# Patient Record
Sex: Male | Born: 1935 | Race: White | Hispanic: No | Marital: Married | Smoking: Former smoker
Health system: Southern US, Community
[De-identification: ages and names within clinical notes are randomized; demographics above are authoritative.]

## PROBLEM LIST (undated history)

## (undated) DIAGNOSIS — I350 Nonrheumatic aortic (valve) stenosis: Secondary | ICD-10-CM

## (undated) DIAGNOSIS — I442 Atrioventricular block, complete: Secondary | ICD-10-CM

## (undated) DIAGNOSIS — I10 Essential (primary) hypertension: Secondary | ICD-10-CM

## (undated) DIAGNOSIS — H9190 Unspecified hearing loss, unspecified ear: Secondary | ICD-10-CM

## (undated) DIAGNOSIS — I639 Cerebral infarction, unspecified: Secondary | ICD-10-CM

## (undated) DIAGNOSIS — S2249XA Multiple fractures of ribs, unspecified side, initial encounter for closed fracture: Secondary | ICD-10-CM

## (undated) HISTORY — DX: Cerebral infarction, unspecified: I63.9

## (undated) HISTORY — PX: HEMORRHOID SURGERY: SHX153

## (undated) HISTORY — DX: Essential (primary) hypertension: I10

## (undated) HISTORY — DX: Atrioventricular block, complete: I44.2

## (undated) HISTORY — DX: Nonrheumatic aortic (valve) stenosis: I35.0

## (undated) HISTORY — DX: Unspecified hearing loss, unspecified ear: H91.90

## (undated) HISTORY — DX: Multiple fractures of ribs, unspecified side, initial encounter for closed fracture: S22.49XA

---

## 2006-07-03 ENCOUNTER — Ambulatory Visit: Payer: Self-pay | Admitting: Gastroenterology

## 2006-07-16 ENCOUNTER — Ambulatory Visit: Payer: Self-pay | Admitting: Gastroenterology

## 2006-07-21 ENCOUNTER — Encounter: Payer: Self-pay | Admitting: Gastroenterology

## 2006-07-21 ENCOUNTER — Ambulatory Visit: Payer: Self-pay | Admitting: Gastroenterology

## 2006-07-28 ENCOUNTER — Ambulatory Visit: Payer: Self-pay | Admitting: Gastroenterology

## 2007-03-04 ENCOUNTER — Encounter: Payer: Self-pay | Admitting: Cardiology

## 2007-09-03 ENCOUNTER — Encounter: Payer: Self-pay | Admitting: Cardiology

## 2007-11-05 ENCOUNTER — Ambulatory Visit: Payer: Self-pay | Admitting: Cardiology

## 2007-11-06 ENCOUNTER — Ambulatory Visit: Payer: Self-pay | Admitting: Cardiology

## 2007-11-24 ENCOUNTER — Ambulatory Visit: Payer: Self-pay | Admitting: Cardiology

## 2007-11-24 ENCOUNTER — Ambulatory Visit: Payer: Self-pay | Admitting: Internal Medicine

## 2007-11-24 LAB — CONVERTED CEMR LAB
CO2: 29 meq/L (ref 19–32)
Calcium: 9.3 mg/dL (ref 8.4–10.5)
Chloride: 100 meq/L (ref 96–112)
Eosinophils Relative: 2.1 % (ref 0.0–5.0)
GFR calc non Af Amer: 70 mL/min
Glucose, Bld: 109 mg/dL — ABNORMAL HIGH (ref 70–99)
INR: 1 (ref 0.8–1.0)
Lymphocytes Relative: 20.9 % (ref 12.0–46.0)
MCHC: 34.2 g/dL (ref 30.0–36.0)
MCV: 83.2 fL (ref 78.0–100.0)
Monocytes Relative: 13.7 % — ABNORMAL HIGH (ref 3.0–11.0)
Neutrophils Relative %: 63.1 % (ref 43.0–77.0)
Potassium: 3.3 meq/L — ABNORMAL LOW (ref 3.5–5.1)
RBC: 4.91 M/uL (ref 4.22–5.81)

## 2007-12-04 ENCOUNTER — Ambulatory Visit: Payer: Self-pay | Admitting: Internal Medicine

## 2007-12-04 ENCOUNTER — Ambulatory Visit (HOSPITAL_COMMUNITY): Admission: RE | Admit: 2007-12-04 | Discharge: 2007-12-05 | Payer: Self-pay | Admitting: Internal Medicine

## 2007-12-04 HISTORY — PX: OTHER SURGICAL HISTORY: SHX169

## 2007-12-21 ENCOUNTER — Ambulatory Visit: Payer: Self-pay

## 2008-03-18 ENCOUNTER — Ambulatory Visit: Payer: Self-pay | Admitting: Internal Medicine

## 2008-04-21 ENCOUNTER — Ambulatory Visit: Payer: Self-pay | Admitting: Internal Medicine

## 2008-12-09 ENCOUNTER — Ambulatory Visit: Payer: Self-pay | Admitting: Internal Medicine

## 2009-03-29 ENCOUNTER — Encounter: Payer: Self-pay | Admitting: Internal Medicine

## 2009-10-07 DIAGNOSIS — I442 Atrioventricular block, complete: Secondary | ICD-10-CM | POA: Insufficient documentation

## 2009-10-07 DIAGNOSIS — R5381 Other malaise: Secondary | ICD-10-CM

## 2009-10-07 DIAGNOSIS — R5383 Other fatigue: Secondary | ICD-10-CM

## 2009-11-30 ENCOUNTER — Encounter: Payer: Self-pay | Admitting: Cardiology

## 2009-12-08 ENCOUNTER — Ambulatory Visit: Payer: Self-pay | Admitting: Internal Medicine

## 2010-07-27 ENCOUNTER — Ambulatory Visit: Payer: Self-pay | Admitting: Internal Medicine

## 2010-07-27 DIAGNOSIS — I10 Essential (primary) hypertension: Secondary | ICD-10-CM | POA: Insufficient documentation

## 2011-01-29 NOTE — Assessment & Plan Note (Signed)
Summary: PER REMINDER LETTER/SN   Visit Type:  Pacemaker check Primary Provider:  Charm Barges   History of Present Illness: The patient presents today for routine electrophysiology followup. He reports doing very well since last being seen in our clinic. The patient denies symptoms of palpitations, chest pain, shortness of breath, orthopnea, PND, lower extremity edema, dizziness, presyncope, syncope, or neurologic sequela. The patient is tolerating medications without difficulties and is otherwise without complaint today.   Preventive Screening-Counseling & Management  Alcohol-Tobacco     Smoking Status: quit     Year Quit: 1980  Current Medications (verified): 1)  Amlodipine Besylate 10 Mg Tabs (Amlodipine Besylate) .... Take 1 Tablet By Mouth Once A Day 2)  Travatan Z 0.004 % Soln (Travoprost) .... Use As Directed 3)  Simvastatin 40 Mg Tabs (Simvastatin) .... Take 1 Tablet By Mouth Once A Day 4)  Metformin Hcl 1000 Mg Tabs (Metformin Hcl) .... Take 1 Tablet By Mouth Two Times A Day 5)  Losartan Potassium 100 Mg Tabs (Losartan Potassium) .... Take 1 Tablet By Mouth Once A Day 6)  Indapamide 2.5 Mg Tabs (Indapamide) .... Take 1 Tablet By Mouth Once A Day 7)  Aspir-Low 81 Mg Tbec (Aspirin) .... Take 1 Tablet By Mouth Once A Day 8)  Pantoprazole Sodium 40 Mg Tbec (Pantoprazole Sodium) .... Take 1 Tablet By Mouth Once A Day 9)  Multivitamins  Tabs (Multiple Vitamin) .... Take 1 Tablet By Mouth Once A Day 10)  Fish Oil 1000 Mg Caps (Omega-3 Fatty Acids) .... Take 1 Tablet By Mouth Once A Day 11)  Cardura 4 Mg Tabs (Doxazosin Mesylate) .... Take 1 Tablet By Mouth Once A Day 12)  Lantus 100 Unit/ml Soln (Insulin Glargine) .... Use As Directed 13)  Gemfibrozil 600 Mg Tabs (Gemfibrozil) .... Take 1 Tablet By Mouth Once A Day 14)  Vitamin D3 5000 Unit Caps (Cholecalciferol) .... Take 1 Tablet By Mouth Once A Day  Allergies (verified): 1)  ! Codeine  Comments:  Nurse/Medical Assistant: The  patient's medication list and allergies were reviewed with the patient and were updated in the Medication and Allergy Lists.  Past History:  Past Medical History: FATIGUE / MALAISE (ICD-780.79) Complete Heart block s/p PPM HTN HL Diabetes  Past Surgical History: hemrrhoid Medtronic Rhame PPM  Social History: Reviewed history from 10/07/2009 and no changes required. Retired  Married  Tobacco Use - Former.  Alcohol Use - no  Vital Signs:  Patient profile:   77 year old male Height:      72 inches Weight:      239 pounds BMI:     32.53 Pulse rate:   79 / minute BP sitting:   116 / 74  (left arm) Cuff size:   large  Vitals Entered By: Carlye Grippe (July 27, 2010 3:18 PM)  Nutrition Counseling: Patient's BMI is greater than 25 and therefore counseled on weight management options.  Physical Exam  General:  Well developed, well nourished, in no acute distress. Head:  normocephalic and atraumatic Eyes:  PERRLA/EOM intact; conjunctiva and lids normal. Mouth:  Teeth, gums and palate normal. Oral mucosa normal. Neck:  Neck supple, no JVD. No masses, thyromegaly or abnormal cervical nodes. Chest Wall:  pacemaker pocket is well healed Lungs:  Clear bilaterally to auscultation and percussion. Heart:  Non-displaced PMI, chest non-tender; regular rate and rhythm, S1, S2 without murmurs, rubs or gallops. Carotid upstroke normal, no bruit. Normal abdominal aortic size, no bruits. Femorals normal pulses, no bruits. Pedals normal  pulses. No edema, no varicosities. Abdomen:  Bowel sounds positive; abdomen soft and non-tender without masses, organomegaly, or hernias noted. No hepatosplenomegaly. Msk:  Back normal, normal gait. Muscle strength and tone normal. Pulses:  pulses normal in all 4 extremities Extremities:  No clubbing or cyanosis. Neurologic:  Alert and oriented x 3.   PPM Specifications Following MD:  Lewayne Bunting, MD     PPM Vendor:  Medtronic     PPM Model Number:   [id number]     PPM Serial Number:  [id number] PPM DOI:  12/04/2007     PPM Implanting MD:  Lewayne Bunting, MD  Lead 1    Location: RA     DOI: 12/04/2007     Model #: [id number]     Serial #: [id number]     Status: active Lead 2    Location: RV     DOI: 12/04/2007     Model #: [id number]     Serial #: [id number]     Status: active  Magnet Response Rate:  BOL 85 ERI 65  Indications:  CHB; brady   PPM Follow Up Battery Voltage:  2.78 V     Battery Est. Longevity:  34yrs     Pacer Dependent:  No       PPM Device Measurements Atrium  Amplitude: 4.00 mV, Impedance: 412 ohms, Threshold: 0.750 V at 0.40 msec Right Ventricle  Amplitude: 8.00 mV, Impedance: 518 ohms, Threshold: 0.750 V at 0.40 msec  Episodes MS Episodes:  23     Percent Mode Switch:  <0.1%     Coumadin:  No Ventricular High Rate:  0     Atrial Pacing:  62.2%     Ventricular Pacing:  99.9%  Parameters Mode:  DDDR     Lower Rate Limit:  60     Upper Rate Limit:  130 Paced AV Delay:  150     Sensed AV Delay:  120 Next Cardiology Appt Due:  12/31/2010 Tech Comments:  23 MODE SWITCHES--<0.1% OF TIME.  NORMAL DEVICE FUNCTION.  CHANGED RA OUTPUT FROM 1.5 TO 2.0 AND RV OUTPUT FROM 2.0 TO 2.5 V.  ROV IN [facility]. Vella Kohler  July 27, 2010 3:43 PM MD Comments:  agree  Impression & Recommendations:  Problem # 1:  AV BLOCK, COMPLETE (ICD-426.0) Normal pacemaker function changes as above  Problem # 2:  HYPERTENSION, MILD (ICD-401.1) stable no changes today  CHF Assessment/Plan:      The patient's current weight is 239 pounds.     Patient Instructions: 1)  Take 1/2 of your simvastatin tablet at bedtime. 2)  Your physician wants you to follow-up in: 6 months. You will receive a reminder letter in the mail one-two months in advance. If you don't receive a letter, please call our office to schedule the follow-up appointment.

## 2011-01-29 NOTE — Cardiovascular Report (Signed)
Summary: Card Device Clinic/ INTERROGATION REPORT  Card Device Clinic/ INTERROGATION REPORT   Imported By: Dorise Hiss 08/03/2010 10:56:01  _____________________________________________________________________  External Attachment:    Type:   Image     Comment:   External Document

## 2011-02-18 ENCOUNTER — Encounter (INDEPENDENT_AMBULATORY_CARE_PROVIDER_SITE_OTHER): Payer: MEDICARE | Admitting: Internal Medicine

## 2011-02-18 ENCOUNTER — Encounter: Payer: Self-pay | Admitting: Internal Medicine

## 2011-02-18 DIAGNOSIS — I442 Atrioventricular block, complete: Secondary | ICD-10-CM

## 2011-02-26 NOTE — Assessment & Plan Note (Signed)
Summary: PC2/LG   Visit Type:  Follow-up Primary Provider:  Charm Barges  CC:  none.  History of Present Illness: The patient presents today for routine electrophysiology followup. He reports doing very well since last being seen in our clinic. The patient denies symptoms of palpitations, chest pain, shortness of breath, orthopnea, PND, lower extremity edema, dizziness, presyncope, syncope, or neurologic sequela. The patient is tolerating medications without difficulties and is otherwise without complaint today.   Current Medications (verified): 1)  Amlodipine Besylate 10 Mg Tabs (Amlodipine Besylate) .... Take 1 Tablet By Mouth Once A Day 2)  Travatan Z 0.004 % Soln (Travoprost) .... Use As Directed 3)  Simvastatin 40 Mg Tabs (Simvastatin) .... Take 1/2 Tablet By Mouth Once A Day 4)  Metformin Hcl 1000 Mg Tabs (Metformin Hcl) .... Take 1 Tablet By Mouth Two Times A Day 5)  Losartan Potassium 100 Mg Tabs (Losartan Potassium) .... Take 1 Tablet By Mouth Once A Day 6)  Indapamide 2.5 Mg Tabs (Indapamide) .... Take 1 Tablet By Mouth Once A Day 7)  Aspir-Low 81 Mg Tbec (Aspirin) .... Take 1 Tablet By Mouth Once A Day 8)  Pantoprazole Sodium 40 Mg Tbec (Pantoprazole Sodium) .... Take 1 Tablet By Mouth Once A Day 9)  Multivitamins  Tabs (Multiple Vitamin) .... Take 1 Tablet By Mouth Once A Day 10)  Fish Oil 1000 Mg Caps (Omega-3 Fatty Acids) .... Take 1 Tablet By Mouth Once A Day 11)  Cardura 4 Mg Tabs (Doxazosin Mesylate) .... Take 1 Tablet By Mouth Once A Day 12)  Lantus 100 Unit/ml Soln (Insulin Glargine) .... Use As Directed 13)  Gemfibrozil 600 Mg Tabs (Gemfibrozil) .... Take 1 Tablet By Mouth Once A Day 14)  Vitamin D3 5000 Unit Caps (Cholecalciferol) .... Take 1 Tablet By Mouth Once A Day 15)  Preservision Areds  Caps (Multiple Vitamins-Minerals) .Marland Kitchen.. 1 By Mouth Two Times A Day  Allergies (verified): 1)  ! Codeine  Past History:  Past Medical History: Reviewed history from 07/27/2010 and  no changes required. FATIGUE / MALAISE (ICD-780.79) Complete Heart block s/p PPM HTN HL Diabetes  Past Surgical History: hemrrhoid Medtronic Sensia PPM implanted 12/04/07 by [facility]  Social History: Reviewed history from 10/07/2009 and no changes required. Retired  Married  Tobacco Use - Former.  Alcohol Use - no  Vital Signs:  Patient profile:   77 year old male Height:      72 inches Weight:      241 pounds BMI:     32.80 Pulse rate:   76 / minute Resp:     16 per minute BP sitting:   129 / 84  (left arm)  Vitals Entered By: Marrion Coy, CNA (February 18, 2011 4:14 PM)  Physical Exam  General:  Well developed, well nourished, in no acute distress. Head:  normocephalic and atraumatic Eyes:  PERRLA/EOM intact; conjunctiva and lids normal. Mouth:  Teeth, gums and palate normal. Oral mucosa normal. Neck:  supple Chest Wall:  pacemaker pocket is well healed Lungs:  Clear bilaterally to auscultation and percussion. Heart:  RRR, no m/r/g Abdomen:  Bowel sounds positive; abdomen soft and non-tender without masses, organomegaly, or hernias noted. No hepatosplenomegaly. Msk:  Back normal, normal gait. Muscle strength and tone normal. Extremities:  No clubbing or cyanosis. Neurologic:  Alert and oriented x 3. Skin:  Intact without lesions or rashes. Psych:  Normal affect.   PPM Specifications Following MD:  Lewayne Bunting, MD     PPM Vendor:  Medtronic  PPM Model Number:  [id number]     PPM Serial Number:  [id number] PPM DOI:  12/04/2007     PPM Implanting MD:  Lewayne Bunting, MD  Lead 1    Location: RA     DOI: 12/04/2007     Model #: 9147     Serial #: [id number]     Status: active Lead 2    Location: RV     DOI: 12/04/2007     Model #: 6578     Serial #: [id number]     Status: active  Magnet Response Rate:  BOL 85 ERI 65  Indications:  CHB; brady   PPM Follow Up Pacer Dependent:  No      Episodes Coumadin:  No  Parameters Mode:  DDDR     Lower Rate Limit:  60      Upper Rate Limit:  130 Paced AV Delay:  150     Sensed AV Delay:  120 MD Comments:  see scanned report  Impression & Recommendations:  Problem # 1:  AV BLOCK, COMPLETE (ICD-426.0) normal pacemaker function see scanned report return to device clinic in 6 months  Problem # 2:  HYPERTENSION, MILD (ICD-401.1) stable no changes today

## 2011-02-26 NOTE — Cardiovascular Report (Signed)
Summary: Card Device Clinic/ FINAL REPORT  Card Device Clinic/ FINAL REPORT   Imported By: Dorise Hiss 02/19/2011 17:02:36  _____________________________________________________________________  External Attachment:    Type:   Image     Comment:   External Document

## 2011-05-14 NOTE — Discharge Summary (Signed)
[name], [name]              ACCOUNT NO.:  [id number]   MEDICAL RECORD NO.:  [id number]          PATIENT TYPE:  OIB   LOCATION:  3741                         FACILITY:  [facility]   PHYSICIAN:  Gerrit Friends. Dietrich Pates, MD, FACCDATE OF BIRTH:  [birth year]   DATE OF ADMISSION:  12/04/2007  DATE OF DISCHARGE:  12/05/2007                               DISCHARGE SUMMARY   PRIMARY CARDIOLOGIST:  Learta Codding, MD,FACC.   ELECTROPHYSIOLOGIST:  Doylene Canning. Ladona Ridgel,   PRIMARY CARE Evah Rashid:  Dr. Samuel Jester.   DISCHARGE DIAGNOSIS:  Symptomatic bradycardia with 2 to 1 second-degree  type 2 heart block.   SECONDARY DIAGNOSES:  1. Hypertension.  2. Type 2 diabetes mellitus.  3. Hypothyroidism.  4. Gastroesophageal reflux disease.  5. Hyperlipidemia.  6. Depression.   ALLERGIES:  CODEINE.   PROCEDURE:  Successful implantation of Medtronic Sensia dual-chamber  permanent pacemaker, model number [id number].   HISTORY OF PRESENT ILLNESS:  A 77 year old male followed by Dr. Andee Lineman.  He had a several month history of shortness of breath and fatigue as  well as occasional dizziness and lightheadedness.  He was placed on a  [employer] monitor by Tereso Newcomer, PA-C and Dr. Andee Lineman and was found to  exhibit second-degree type 2 heart block with ventricular rates in the  40s to 50s.  He was therefore arranged for follow-up with Dr. Ladona Ridgel on  November 24, 2007, and subsequently scheduled for pacemaker placement.   HOSPITAL COURSE:  Patient was taken to the EP lab on December 04, 2007,  and underwent successful placement of Medtronic [place] dual-chamber  permanent pacemaker as outlined above.  He tolerated this procedure well  and postprocedure chest x-ray showed no effusions or pneumothorax.  We  will arrange for outpatient follow-up in device clinic as well as with  Dr. Ladona Ridgel and the patient will be discharged home today in good  condition.   DISCHARGE LABORATORY DATA:  None.   DISPOSITION:  The  patient is being discharged home today in good  condition.   FOLLOW UP PLANS AND APPOINTMENTS:  Will arrange for device clinic follow-  up in approximately 10-14 days.  This will take place in our [place]  office.  Follow up with Dr. Ladona Ridgel approximately three months.  Follow  up with Dr. Andee Lineman as previously scheduled.   DISCHARGE MEDICATIONS:  1. Paroxetine 40 mg daily.  2. Indapamide 2.5 mg daily.  3. Glyburide 10 mg daily.  4. Metformin 1000 mg daily.  5. Simvastatin 10 mg nightly.  6. Travatan 0.04% one drop both eyes daily.  7. Amlodipine 5 mg daily.  8. Levothyroxine 50 mcg daily.  9. Protonix 40 mg daily.  10.Losartan 100 mg daily.  11.Aspirin 81 mg daily.  12.Multivitamin with iron daily.   OUTSTANDING LABORATORY STUDIES:  None.   DURATION OF DISCHARGE ENCOUNTER:  31 minutes including physician time.      Nicolasa Ducking, ANP      Gerrit Friends. Dietrich Pates, MD, [facility]  Electronically Signed    CB/MEDQ  D:  12/05/2007  T:  12/06/2007  Job:  [id number]   cc:   Aram Beecham  Charm Barges

## 2011-05-14 NOTE — Op Note (Signed)
[name], [name]              ACCOUNT NO.:  [id number]   MEDICAL RECORD NO.:  [id number]          PATIENT TYPE:  OIB   LOCATION:  2807                         FACILITY:  [facility]   PHYSICIAN:  Doylene Canning. Ladona Ridgel, MD    DATE OF BIRTH:  08-20-1933   DATE OF PROCEDURE:  12/04/2007  DATE OF DISCHARGE:                               OPERATIVE REPORT   PROCEDURE PERFORMED:  Implantation of a dual-chamber pacemaker.   INDICATIONS:  Symptomatic bradycardia with complete heart block.   INTRODUCTION:  The patient is a 77 year old male with a history of high-  grade heart block, who is referred down from Dr. Andee Lineman.  He was mildly  symptomatic.  He notes that over the last several days he has gotten  more fatigued and was found today to be in complete heart block and is  referred in for pacemaker implantation.   PROCEDURE:  After informed consent was obtained, the patient was taken  to the diagnostic EP lab in a fasting state.  After usual the  preparation and draping, intravenous fentanyl and midazolam were given  for sedation.  Lidocaine 30 mL was infiltrated into the left  infraclavicular region.  A 5-cm incision was carried out over this  region and electrocautery was utilized to dissect down to the fascial  plane.  The left subclavian vein was then punctured x2 and the Medtronic  model 5076 58-cm active-fixation pacing lead, serial number [id number],  was advanced into the right ventricle and the Medtronic model 5076 52-cm  active-fixation pacing lead, serial number [id number], advanced to the  right atrium.  Mapping was carried out in the right ventricle.  At the  final site on the RV septum the R-waves measured 8 mV  and the pacing  threshold was a volt at 0.5 msec.  Ten-volt pacing did not stimulate the  diaphragm.  The pacing impedance was satisfactory though I do not have  it at the time of this dictation.  Next, attention was then turned  placement of the atrial lead, which was  placed in the anterolateral  portion of the right atrium where P-waves measured 6 mV and the  threshold 0.5 V at 0.5 msec, and again 10-volt pacing did not stimulate  the diaphragm.  There was a nice injury current noted.  Again the pacing  impedance was within normal limits but not available at time of this  dictation.  At this point with the leads in satisfactory position, they  was secured to the subpectoralis fascia with a silk suture.  The sewing  sleeve was also secured with silk suture.  Electrocautery was utilized  to assure subcutaneous a pocket and kanamycin irrigation was utilized to  irrigate the pocket.  Electrocautery was utilized to assure hemostasis.  The Medtronic Sensia dual-chamber pacemaker serial, number [id number],  was connected to the atrial and ventricular leads and placed back in the  subcutaneous pocket.  The generator was secured with silk suture.  At  this point the incision was closed with a layer of 2-0 Vicryl, followed  by a layer of 3-0 Vicryl.  Benzoin was  painted on the skin and Steri-  Strips were applied and a pressure dressing was placed and the patient  was returned to his room in satisfactory condition.   COMPLICATIONS:  There were no immediate procedure complications.   RESULTS:  This demonstrates successful implantation of a Medtronic dual-  chamber pacemaker in a patient with symptomatic bradycardia with  complete heart block.      Doylene Canning. Ladona Ridgel, MD  Electronically Signed     GWT/MEDQ  D:  12/04/2007  T:  12/04/2007  Job:  [id number]   cc:   Learta Codding, MD,FACC

## 2011-05-14 NOTE — Assessment & Plan Note (Signed)
[facility]                                 ON-CALL NOTE   NAME:[name], [name]                     MRN:          [id number]  DATE:11/23/2007                            DOB:          02/01/1933    DATE OF PHONE CALL:  November 23, 2007, at approximately 6:30 a.m.   PRIMARY CARDIOLOGIST:  Learta Codding, MD   PRIMARY CARE PHYSICIAN:  Dr. Charm Barges.   [employer] employee [employer] contacted me with an urgent page in regard to  Mr. Weitzel with bradycardia.  He stated the heart rate was in the low  30s and the monitor showed second-degree heart block.  The monitor was  self-activated and was not activated by the patient. A  recheck showed  the heart rate in the upper 30s, again second-degree AV block.   I asked [employer] to fax me the rhythm strips so that I may review them.  After review, I agree with their interpretation.  I have spoken with the  [facility] office and asked them to review these with Dr. Andee Lineman for possible  further treatment plan as his E-Chart cardiology note dated November 05, 2007, refers to multiple tests that have recently been performed such as  an echocardiogram and exercise stress test to look at chronotropic  incompetence and a Cardiolite to rule out ischemic heart disease and the  possible need for a pacer.  They also plan to contact the patient with  further recommendations.      Joellyn Rued, PA-C  Electronically Signed      Pricilla Riffle, MD, [facility]  Electronically Signed   EW/MedQ  DD: 11/23/2007  DT: 11/23/2007  Job #: [id number]   cc:   Learta Codding, MD,FACC

## 2011-05-14 NOTE — Cardiovascular Report (Signed)
[place] [facility]                   [facility] CL[facility]IC NOTE   NAME:[name], [name]                     MRN:          [id number]  DATE:12/09/2008                            DOB:          06/15/1933    Mr. [name] was seen following pacemaker implantation about a year ago  that was done by Dr. Lewayne Bunting.   I met him last spring.  The other symptoms were exercise intolerance.  This has much improved; however, he continues to wish he had more  energy.   Review of his medications which are legion include amlodipine, losartan,  indapamide, glyburide, pantoprazole,  Tripilix, simvastatin.  He is not  on a beta-blocker.  He has normal left ventricular function.   On examination today, his blood pressure was 144/84 with a pulse of 84,  weight is 246.  His lungs were clear.  Heart sounds were regular without  murmurs or gallops.  The abdomen was soft.  The extremities had no  edema.   Interrogation of his Medtronic [place] ICD demonstrates a P-wave of 2.8  with impedance of 391, a threshold 0.5 and 0.4.  There was no intrinsic  ventricular rhythm with impedance of 578, threshold of 0.87 and 0.4.  He  was 100% ventricular paced, and 30% atrial paced.   IMPRESSION:  1. Complete heart block.  2. Status post pacer for the above.  3. Normal left ventricular function.  4. Diabetes.  5. Fatigue.   Mr. [name] is doing well from arrhythmia point of view.  He does have  significant snoring, but no daytime somnolence.  I wonder whether a  sleep study might be worth repeating.   I will defer this to Dr. Charm Barges.   We will see him again in 3 months' time for device followup.     Duke Salvia, MD, [facility]  Electronically Signed    SCK/MedQ  DD: 12/09/2008  DT: 12/10/2008  Job #: [id number]   cc:   Samuel Jester

## 2011-05-14 NOTE — Cardiovascular Report (Signed)
[place] [facility]                   [facility]ICE CLINIC NOTE   NAME:[name], [name]                     MRN:          [id number]  DATE:04/21/2008                            DOB:          [birth year]    [name] is seen following pacemaker implantation in the fall by Dr.  Lewayne Bunting, for symptomatic high-grade heart block associated syncope  and exercise intolerance.  He is much improved without recurrence of  syncope and resolution of the most of the exercise intolerance.   His medications are unchanged, except for the intercurrent up-titration  of his metformin to 1000 b.i.d.  He is also on glyburide, indapamide,  Losartan, simvastatin and verapamil.   PHYSICAL EXAMINATION:  On examination today his blood pressure was  132/82, his pulse was 70.  LUNGS:  Clear.  HEART:  Sounds were regular.  EXTREMITIES:  Without edema.  He was in no acute distress.  His device pocket was well-healed.   Interrogation of his Medtronic SENSA pulse generator demonstrated a P-  wave of 240 with an impedance of 746, with threshold of 0.75 and 0.4.  There was no intrinsic ventricular rhythm.  He was paced at 30 with  impedance of 527, and a threshold of 0.5 at 0.4.   IMPRESSION:  1. High-grade heart block; now complete heart block.  2. Status post pacer for the above.  3. Syncope and exercise intolerance, related to #1.  4. Normal left ventricular function, with nonischemic Myoview at 1108.  5. Diabetes.   [name] is doing well.  At this point, we will plan to see him again  in 7 months' time; at the anniversary of his device.  Will then decide  with Dr. Andee Lineman as to long-term device and cardiology follow-up.     Duke Salvia, MD, [facility]  Electronically Signed    SCK/MedQ  DD: 04/21/2008  DT: 04/21/2008  Job #: [id number]   cc:   Samuel Jester

## 2011-05-14 NOTE — Assessment & Plan Note (Signed)
[facility]                         ELECTROPHYSIOLOGY OFFICE NOTE   NAME:BULLINSRamere, Downs                     MRN:          [id number]  DATE:11/24/2007                            DOB:          [birth year]    REFERRING PHYSICIAN:  Learta Codding, MD,FACC   CHIEF COMPLAINT:  Mr. Rosero is referred today by Dr. Learta Codding for  evaluation of symptomatic 2:1 heart block.   HISTORY OF PRESENT ILLNESS:  The patient is a very pleasant 77 year old  man with a history of coronary artery disease and symptomatic  bradycardia.  The patient who is here with his wife today, states that  he has been short of breath and fatigued for the last several months.  He has occasional dizziness and lightheadedness but has never had frank  syncope.  These symptoms can occur at any time, but typically are more  frequent when he is active.  When he is sitting around and doing  nothing, he feels fine.  He was subsequently found to have a 2:1 heart  block with a ventricular rate of 33 beats per minute in the office  several weeks ago, and is referred now for additional evaluation.  His  heart failure symptoms are class 2.  Otherwise he denies orthopnea or  PND.   PAST MEDICAL HISTORY:  1. Notable for diabetes.  2. Hypertension.  3. Dyslipidemia.  4. History of treated hypothyroidism.  5. History of gastroesophageal reflux disease.  6. History of depression.   PREVIOUS MEDICATIONS:  1. Verapamil, but he is on none now.  2. Terazosin.  3. Simvastatin.  4. Paroxetine 40 mg daily.  5. Metformin 500 mg twice daily.  6. Losartan 100 mg daily.  7. Glyburide 5 mg twice daily.  8. Aspirin 81 mg daily.  9. Now amlodipine 10 mg daily.  10.He also takes fish oil.   ALLERGIES:  CODEINE.   SOCIAL HISTORY:  The patient has a history of tobacco use but stopped  smoking about 15 years ago.  He denies alcohol or drug use.  He is a  retired Psychiatric nurse.  He lives in  [place].   FAMILY HISTORY:  Notable for his mother dying suddenly during pregnancy.  His father lived into his 61's with coronary artery disease.   REVIEW OF SYSTEMS:  Notable for occasional chronic productive cough with  clear sputum.  There is a history of snoring.  There is no history of  fevers or chills.  Otherwise there are no symptoms except for very mild  claudication when he walks.  All other systems are noted in the HPI.   PHYSICAL EXAMINATION:  GENERAL:  He is a pleasant 77 year old man, in no  acute distress.  VITAL SIGNS:  Blood pressure today 124/72, pulse 44 and regular,  respirations 18.  Weight was 243 pounds.  NECK:  No jugular venous distention, no thyromegaly.  Trachea midline.  Carotids 2+ and symmetric.  LUNGS:  Clear bilaterally to auscultation.  No wheezes, rales or rhonchi  were present.  There was no increased work of breathing.  CARDIOVASCULAR:  A regular bradycardia with  normal S1 and S2.  PMI was  not enlarged and was laterally displaced.  EXTREMITIES:  No clubbing, cyanosis or edema.  Pulses 2+ and symmetric.  NEUROLOGIC:  Alert and oriented x3.  Cranial nerves intact, 5/5 and  symmetric.   Electrocardiogram demonstrates sinus rhythm with right bundle branch  block, as well as sinus rhythm with a 2:1 heart block and underlying  right bundle branch block and left axis.   IMPRESSION:  1. Symptomatic bradycardia.  2. Hypertension.  3. Diabetes.   DISCUSSION:  I have discussed the treatment options with the patient.  The risks, benefits, goals and expectations of  a pacemaker implantation  have been discussed with this patient and with his family.  He wishes to  proceed.     Doylene Canning. Ladona Ridgel, MD  Electronically Signed    GWT/MedQ  DD: 11/24/2007  DT: 11/24/2007  Job #: [id number]   cc:   Learta Codding, MD,FACC

## 2011-05-14 NOTE — Assessment & Plan Note (Signed)
[facility]                          [facility] OFFICE NOTE   NAME:[name], [name]                     MRN:          [id number]  DATE:11/05/2007                            DOB:          10/17/1933    ADDENDUM:  Send copies of the original report to Dr. Charm Barges and Dr.  Antionette Poles.      Tereso Newcomer, PA-C  Electronically Signed      Learta Codding, MD,FACC  Electronically Signed   SW/MedQ  DD: 11/05/2007  DT: 11/05/2007  Job #: [id number]   cc:   Rickard Patience

## 2011-05-14 NOTE — Assessment & Plan Note (Signed)
[facility]                                 ON-CALL NOTE   NAME:Gras, [name]                     MRN:          [id number]  DATE:11/23/2007                            DOB:          04/05/1933    I received a phone call from [facility] at approximately 10 p.m. on this  patient concerning a second degree type 2 heart block.  The ventricular  heart rate was approximately 46-50 beats per minute.  According to  [facility] the patient is asymptomatic.  According to [facility] the  patient has been in this rhythm all day long.  I asked the [facility]  folks to fax over the telemetry and they agreed to do so.  The patient  may well need a pacemaker and should have follow-up with Learta Codding,  MD,FACC and Duke Salvia, MD, [facility] concerning this.     Christell Faith, MD  Electronically Signed    NDL/MedQ  DD: 11/24/2007  DT: 11/24/2007  Job #: [id number]

## 2011-05-14 NOTE — Assessment & Plan Note (Signed)
[facility]                          [facility] OFFICE NOTE   NAME:[name], [name]                     MRN:          [id number]  DATE:11/05/2007                            DOB:          March 30, 1933    REASON FOR REFERRAL:  Bradycardia.   HISTORY OF PRESENT ILLNESS:  [name] is a 77 year old male patient  without previously documented coronary artery disease who presents to  the office today for further evaluation of symptomatic bradycardia. He  has noted symptoms over the last 2-3 months. He has been fairly tired  and weak. He is also quite sleepy. He notes occasional dizziness and  lightheadedness. He has also noted near syncope. He denies any frank  syncope. These symptoms usually occur when he is standing or active. He  denies any symptoms completely at rest. He does note some occasional  chest pain that is right-sided. He describes it as a soreness. It has  been fairly constant for several weeks now. He denies any exertional  chest heaviness or tightness. He does note dyspnea on exertion. He  describes NYHA Class IIB symptoms. He denies orthopnea, PND or pedal  edema.   PAST MEDICAL HISTORY:  1. Diabetes mellitus.  2. Hypertension.  3. Treated dyslipidemia.  4. Recently diagnosed hypothyroidism.      a.     He ran out of his Synthroid samples recently and is       currently not on any medical therapy.  5. Gastroesophageal reflux disease.      a.     History of esophageal dilatation in the past.  6. Osteoarthritis.  7. Glaucoma.  8. Benign prostatic hypertrophy.  9. Depression.  10.Status post hemorrhoid surgery.   CURRENT MEDICATIONS:  1. Verapamil 120 mg b.i.d.  2. Travoprost eye drops.  3. Terazosin 1 mg daily.  4. Simvastatin 20 mg daily.  5. Paroxetine 40 mg daily.  6. Metformin 500 mg b.i.d.  7. Losartan 100 mg daily.  8. Indapamide 2.5 mg daily.  9. Glyburide 5 mg b.i.d.  10.Aspirin 81 mg daily.  11.Pantoprazole 40  mg daily.  12.Multivitamin.  13.Fish oil 1 gram b.i.d.   ALLERGIES:  CODEINE.   SOCIAL HISTORY:  The patient is an ex-smoker. He has a 30 pack year  history and quit smoking 15 years ago. He denies alcohol or drug abuse.  He is retired from [employer]. He is married and has three  children. He lives in [place].   FAMILY HISTORY:  His mother died suddenly at age 45 during pregnancy. He  thinks his father may have had coronary disease in his 58s.   REVIEW OF SYSTEMS:  As per HPI. He has a chronic cough productive of  clear sputum. He also notes history of snoring and witnessed apneic  episodes. He is supposed to have a sleep study done through the [place]. He  denies fevers or chills. He denies any hemoptysis. He denies any  dysuria, hematuria, melena or hematochezia. Denies any monocular  blindness, unilateral weakness or difficulty with speech or facial  droop. He denies any symptoms consistent with claudication.  He denies  any skin or hair changes. Denies any weight gain or weight loss. Rest of  the review of systems are negative.   PHYSICAL EXAMINATION:  He is a well-nourished, well-developed male in no  acute distress. Blood pressure 120/60, pulse 46. Weight 243.4 pounds.  HEENT: Is normal.  NECK: Without JVD, lymphadenopathy.  ENDOCRINE: Without thyromegaly.  CARDIAC: Normal S1, S2, regular rate and rhythm with a 2/6 systolic  ejection murmur heard best along the left sternal border.  LUNGS:  Are clear to auscultation bilaterally without wheezing, rhonchi  or rales.  ABDOMEN: Soft and nontender with normoactive bowel sounds. No  organomegaly.  EXTREMITIES: Without edema. Calves are soft and nontender.  SKIN: Is warm and dry.  NEUROLOGIC: He is alert and oriented x3. Cranial nerves II-XII are  grossly intact.  VASCULAR: Carotids are without bruits bilaterally.   Electrocardiogram reveals second degree AV block type 2 with a widened  QRS at 138 milliseconds, right  bundle branch block pattern with  ventricular rate of 55. Rhythm strip reveals 2:1 AV block.   IMPRESSION:  1. Second-degree AV block type 2 with intermittent 2:1 AV block.  2. Atypical chest pain.  3. Dyspnea with exertion.      a.     Question secondary to #1.      b.     Rule out ischemic heart disease.  4. Diabetes mellitus.  5. Hypertension.  6. Hyperlipidemia.  7. Recently diagnosed hypothyroidism.      a.     Currently off of medical therapy.  8. Gastroesophageal reflux disease.      a.     Status post esophageal dilatation.  9. Depression.  10.Benign prostatic hypertrophy.  11.Glaucoma.  12.Osteoarthritis.  13.Ex-smoker.  14.Systolic murmur.   PLAN:  The patient was also interviewed and examined by Dr. Andee Lineman  today. He presents with symptomatic second-degree AV block type 2. He  has not had an episode of syncope. Dr. Andee Lineman was in touch with Dr.  Graciela Husbands via telephone. He may indeed need permanent pacemaker  implantation. He would like to have approval through the [place] should this  be necessary. He is on high dose verapamil. At this point in time, I  plan to:  1. Discontinue his verapamil.  2. Substitute Norvasc 5 mg daily for blood pressure control.  3. Restart his Synthroid at 50 mcg daily. We will recheck thyroid      function studies and make sure he has followup with Dr. Charm Barges for      this.  4. He will be set up for an echocardiogram secondary to his systolic      murmur and dyspnea.  5. He will be set up for an exercise treadmill test tomorrow to rule      out chronotropic incompetence and worsening high grade heart block.  6. We will also arrange a treadmill Cardiolite test next week to rule      out ischemic heart disease.  7. We will also set him up for a [facility] event monitor for two      weeks. If he continues to exhibit high grade heart block even after      his discontinued verapamil then he will indeed need pacemaker      implantation.  8. The  patient and his wife do want to go to [place] [employer] [place] in the next      two weeks. As long as he remains stable, this should be fine. If he  has any worsening heart block on this monitor or his treadmill test      tomorrow then they will need to postpone their trip.  9. We will see him back in this office in three weeks to make a final      disposition. He knows to return sooner if he has any syncopal      episodes.      Tereso Newcomer, PA-C  Electronically Signed      Learta Codding, MD,FACC  Electronically Signed   SW/MedQ  DD: 11/05/2007  DT: 11/05/2007  Job #: [id number]   cc:   Rickard Patience, MD

## 2011-07-04 ENCOUNTER — Encounter: Payer: Self-pay | Admitting: Internal Medicine

## 2011-08-23 ENCOUNTER — Ambulatory Visit (INDEPENDENT_AMBULATORY_CARE_PROVIDER_SITE_OTHER): Payer: Medicare Other | Admitting: *Deleted

## 2011-08-23 DIAGNOSIS — I442 Atrioventricular block, complete: Secondary | ICD-10-CM

## 2011-08-23 LAB — PACEMAKER DEVICE OBSERVATION
AL IMPEDENCE PM: 366 Ohm
BAMS-0001: 175 {beats}/min
RV LEAD AMPLITUDE: 5.6 mv
RV LEAD IMPEDENCE PM: 497 Ohm

## 2011-08-23 NOTE — Progress Notes (Signed)
Pacer check in clinic  

## 2012-02-21 ENCOUNTER — Encounter: Payer: Self-pay | Admitting: Internal Medicine

## 2012-02-21 ENCOUNTER — Ambulatory Visit (INDEPENDENT_AMBULATORY_CARE_PROVIDER_SITE_OTHER): Payer: Medicare Other | Admitting: Internal Medicine

## 2012-02-21 DIAGNOSIS — I739 Peripheral vascular disease, unspecified: Secondary | ICD-10-CM | POA: Insufficient documentation

## 2012-02-21 DIAGNOSIS — I10 Essential (primary) hypertension: Secondary | ICD-10-CM

## 2012-02-21 DIAGNOSIS — Z87891 Personal history of nicotine dependence: Secondary | ICD-10-CM

## 2012-02-21 DIAGNOSIS — M79609 Pain in unspecified limb: Secondary | ICD-10-CM

## 2012-02-21 DIAGNOSIS — M79606 Pain in leg, unspecified: Secondary | ICD-10-CM

## 2012-02-21 DIAGNOSIS — I442 Atrioventricular block, complete: Secondary | ICD-10-CM

## 2012-02-21 LAB — PACEMAKER DEVICE OBSERVATION
AL IMPEDENCE PM: 411 Ohm
BATTERY VOLTAGE: 2.76 V
RV LEAD AMPLITUDE: 4 mv
VENTRICULAR PACING PM: 100

## 2012-02-21 NOTE — Patient Instructions (Addendum)
Your physician wants you to follow-up in: 6 months with [facility] device clinic.  You will receive a reminder letter in the mail two months in advance. If you don't receive a letter, please call our office to schedule the follow-up appointment.  Your physician has requested that you have an abdominal aorta duplex. During this test, an ultrasound is used to evaluate the aorta. Allow 30 minutes for this exam. Do not eat after midnight the day before and avoid carbonated beverages  Your physician has requested that you have a lower extremity arterial exercise duplex. During this test, exercise and ultrasound are used to evaluate arterial blood flow in the legs. Allow one hour for this exam. There are no restrictions or special instructions.  Your physician has requested that you have an ankle brachial index (ABI). During this test an ultrasound and blood pressure cuff are used to evaluate the arteries that supply the arms and legs with blood. Allow thirty minutes for this exam. There are no restrictions or special instructions.

## 2012-02-21 NOTE — Assessment & Plan Note (Signed)
Stable Given advanced age, HTN, and prior tobacco, will order abdominal US to evaluate for aneurysm/ aortic disease

## 2012-02-21 NOTE — Progress Notes (Signed)
PCP:  BUTLER,[name], DO, DO  The patient presents today for routine electrophysiology followup.  Since last being seen in our clinic, the patient reports doing very well.  He reports occasional claudication.  He feels that this has improved recently.  Today, he denies symptoms of palpitations, chest pain, shortness of breath, orthopnea, PND, lower extremity edema, dizziness, presyncope, syncope, or neurologic sequela.  The patient feels that he is tolerating medications without difficulties and is otherwise without complaint today.   Past Medical History  Diagnosis Date  . Malaise and fatigue   . Complete heart block     s/p PPM  . HTN (hypertension)   . Diabetes mellitus   . HL (hearing loss)    Past Surgical History  Procedure Date  . Hemorrhoid surgery   . Medtronic senia 12/04/07    PPM implantaed by [facility]    Current Outpatient Prescriptions  Medication Sig Dispense Refill  . amLODipine (NORVASC) 10 MG tablet Take 10 mg by mouth daily.        Marland Kitchen aspirin (ASPIR-LOW) 81 MG EC tablet Take 81 mg by mouth daily.        . Cholecalciferol (VITAMIN D3) 5000 UNITS CAPS Take by mouth daily.        Marland Kitchen doxazosin (CARDURA) 4 MG tablet Take 4 mg by mouth daily.        Marland Kitchen gemfibrozil (LOPID) 600 MG tablet Take 600 mg by mouth 2 (two) times daily.       . indapamide (LOZOL) 2.5 MG tablet Take 2.5 mg by mouth daily.        . insulin glargine (LANTUS) 100 UNIT/ML injection Inject 100 Units into the skin as directed.        Marland Kitchen LORazepam (ATIVAN) 0.5 MG tablet Take 0.5 mg by mouth daily as needed.        Marland Kitchen losartan (COZAAR) 100 MG tablet Take 100 mg by mouth daily.        . metFORMIN (GLUMETZA) 1000 MG (MOD) 24 hr tablet Take 1,000 mg by mouth 2 (two) times daily.        . Multiple Vitamins-Minerals (MULTIVITAL) tablet Take 1 tablet by mouth daily.        . Multiple Vitamins-Minerals (PRESERVISION AREDS) CAPS Take by mouth 2 (two) times daily.        . Omega-3 Fatty Acids (FISH OIL) 1000 MG CAPS Take 1  each by mouth daily.       . pantoprazole (PROTONIX) 40 MG tablet Take 40 mg by mouth daily.        . simvastatin (ZOCOR) 40 MG tablet Take 40 mg by mouth daily. Take 1/2 tab       . travoprost, benzalkonium, (TRAVATAN Z) 0.004 % ophthalmic solution 1 drop as directed.          Allergies  Allergen Reactions  . Codeine     REACTION: syncope    History   Social History  . Marital Status: Married    Spouse Name: N/A    Number of Children: N/A  . Years of Education: N/A   Occupational History  . Not on file.   Social History Main Topics  . Smoking status: Former Smoker    Quit date: 02/20/1982  . Smokeless tobacco: Not on file  . Alcohol Use: No  . Drug Use: Not on file  . Sexually Active: Not on file   Other Topics Concern  . Not on file   Social History Narrative   Retired  Family History  Problem Relation Age of Onset  . Hypertension Other     ROS-  All systems are reviewed and are negative except as outlined in the HPI above   Physical Exam: Filed Vitals:   02/21/12 1025  BP: 140/80  Pulse: 76  Height: 6' (1.829 m)  Weight: 240 lb (108.863 kg)    GEN- The patient is well appearing, alert and oriented x 3 today.   Head- normocephalic, atraumatic Eyes-  Sclera clear, conjunctiva pink Ears- hearing intact Oropharynx- clear Neck- supple, no JVP Lymph- no cervical lymphadenopathy Lungs- Clear to ausculation bilaterally, normal work of breathing Chest- pacemaker pocket is well healed Heart- Regular rate and rhythm, no murmurs, rubs or gallops, PMI not laterally displaced GI- soft, NT, ND, + BS Extremities- no clubbing, cyanosis, or edema, diminished DP/:PT pulses MS- no significant deformity or atrophy Skin- no rash or lesion, decrease hair on legs Psych- euthymic mood, full affect Neuro- strength and sensation are intact  Pacemaker interrogation- reviewed in detail today,  See PACEART report  Assessment and Plan:

## 2012-02-21 NOTE — Assessment & Plan Note (Signed)
Give prior tobacco use and diminished DP/PT pulses, will check noninvasive studies of legs to evaluate for PAD

## 2012-02-21 NOTE — Assessment & Plan Note (Signed)
Normal pacemaker function See Pace Art report No changes today  

## 2012-03-05 ENCOUNTER — Encounter (INDEPENDENT_AMBULATORY_CARE_PROVIDER_SITE_OTHER): Payer: Medicare Other

## 2012-03-05 DIAGNOSIS — I700 Atherosclerosis of aorta: Secondary | ICD-10-CM

## 2012-03-05 DIAGNOSIS — I442 Atrioventricular block, complete: Secondary | ICD-10-CM

## 2012-03-05 DIAGNOSIS — Z87891 Personal history of nicotine dependence: Secondary | ICD-10-CM

## 2012-03-11 ENCOUNTER — Encounter (INDEPENDENT_AMBULATORY_CARE_PROVIDER_SITE_OTHER): Payer: Medicare Other

## 2012-03-11 DIAGNOSIS — M79606 Pain in leg, unspecified: Secondary | ICD-10-CM

## 2012-03-11 DIAGNOSIS — E1159 Type 2 diabetes mellitus with other circulatory complications: Secondary | ICD-10-CM

## 2012-03-11 DIAGNOSIS — I442 Atrioventricular block, complete: Secondary | ICD-10-CM

## 2012-03-30 ENCOUNTER — Encounter: Payer: Self-pay | Admitting: Internal Medicine

## 2012-10-27 ENCOUNTER — Telehealth: Payer: Self-pay | Admitting: *Deleted

## 2012-10-27 NOTE — Telephone Encounter (Signed)
Patient called stating he was due for device check 6 months after last check in February 2013.

## 2012-10-29 NOTE — Telephone Encounter (Signed)
Patient informed of his appointment

## 2012-11-06 ENCOUNTER — Encounter: Payer: Self-pay | Admitting: *Deleted

## 2012-11-06 DIAGNOSIS — Z950 Presence of cardiac pacemaker: Secondary | ICD-10-CM | POA: Insufficient documentation

## 2012-11-12 ENCOUNTER — Ambulatory Visit (INDEPENDENT_AMBULATORY_CARE_PROVIDER_SITE_OTHER): Payer: Medicare Other | Admitting: *Deleted

## 2012-11-12 DIAGNOSIS — I442 Atrioventricular block, complete: Secondary | ICD-10-CM

## 2012-11-12 DIAGNOSIS — Z950 Presence of cardiac pacemaker: Secondary | ICD-10-CM

## 2012-11-12 LAB — PACEMAKER DEVICE OBSERVATION
AL AMPLITUDE: 2.8 mv
BAMS-0001: 175 {beats}/min

## 2012-11-12 NOTE — Progress Notes (Signed)
Pacer check in clinic  

## 2012-11-12 NOTE — Addendum Note (Signed)
Addended by: Donalynn Furlong on: 11/12/2012 01:51 PM   Modules accepted: Orders

## 2012-12-01 ENCOUNTER — Encounter: Payer: Self-pay | Admitting: Internal Medicine

## 2013-05-20 ENCOUNTER — Encounter: Payer: Self-pay | Admitting: Internal Medicine

## 2013-05-20 ENCOUNTER — Ambulatory Visit (INDEPENDENT_AMBULATORY_CARE_PROVIDER_SITE_OTHER): Payer: Medicare Other | Admitting: Internal Medicine

## 2013-05-20 VITALS — BP 114/69 | HR 80 | Ht 72.0 in | Wt 232.8 lb

## 2013-05-20 DIAGNOSIS — I442 Atrioventricular block, complete: Secondary | ICD-10-CM

## 2013-05-20 DIAGNOSIS — I10 Essential (primary) hypertension: Secondary | ICD-10-CM

## 2013-05-20 LAB — PACEMAKER DEVICE OBSERVATION
AL IMPEDENCE PM: 372 Ohm
AL THRESHOLD: 0.5 V
BAMS-0001: 175 {beats}/min
BATTERY VOLTAGE: 2.74 V
RV LEAD AMPLITUDE: 4 mv
RV LEAD IMPEDENCE PM: 517 Ohm
RV LEAD THRESHOLD: 0.75 V
VENTRICULAR PACING PM: 99.9

## 2013-05-20 NOTE — Progress Notes (Signed)
PCP:  Samuel Jester, DO  The patient presents today for routine electrophysiology followup.  Since last being seen in our clinic, the patient reports doing very well.  He has not been active lately.  Today, he denies symptoms of palpitations, chest pain, shortness of breath, orthopnea, PND, lower extremity edema, dizziness, presyncope, syncope, or neurologic sequela.  The patient feels that he is tolerating medications without difficulties and is otherwise without complaint today.   Past Medical History  Diagnosis Date  . Malaise and fatigue   . Complete heart block     s/p PPM  . HTN (hypertension)   . Diabetes mellitus   . HL (hearing loss)    Past Surgical History  Procedure Laterality Date  . Hemorrhoid surgery    . Medtronic senia  12/04/07    PPM implantaed by [facility]    Current Outpatient Prescriptions  Medication Sig Dispense Refill  . amLODipine (NORVASC) 10 MG tablet Take 10 mg by mouth daily.        Marland Kitchen aspirin (ASPIR-LOW) 81 MG EC tablet Take 81 mg by mouth daily.        . Cholecalciferol (VITAMIN D3) 5000 UNITS CAPS Take by mouth daily.        Marland Kitchen doxazosin (CARDURA) 4 MG tablet Take 4 mg by mouth daily.        Marland Kitchen escitalopram (LEXAPRO) 20 MG tablet Take 20 mg by mouth daily.      Marland Kitchen gemfibrozil (LOPID) 600 MG tablet Take 600 mg by mouth 2 (two) times daily.       . indapamide (LOZOL) 2.5 MG tablet Take 2.5 mg by mouth daily.        . insulin glargine (LANTUS) 100 UNIT/ML injection Inject 100 Units into the skin as directed.        Marland Kitchen LORazepam (ATIVAN) 0.5 MG tablet Take 0.5 mg by mouth daily as needed.        Marland Kitchen losartan (COZAAR) 100 MG tablet Take 100 mg by mouth daily.        . metFORMIN (GLUMETZA) 1000 MG (MOD) 24 hr tablet Take 1,000 mg by mouth 2 (two) times daily.        . Multiple Vitamins-Minerals (MULTIVITAL) tablet Take 1 tablet by mouth daily.        . Multiple Vitamins-Minerals (PRESERVISION AREDS) CAPS Take by mouth 2 (two) times daily.        . pantoprazole  (PROTONIX) 40 MG tablet Take 40 mg by mouth daily.        . simvastatin (ZOCOR) 40 MG tablet Take 20 mg by mouth daily.       . travoprost, benzalkonium, (TRAVATAN Z) 0.004 % ophthalmic solution 1 drop as directed.        No current facility-administered medications for this visit.    Allergies  Allergen Reactions  . Codeine     REACTION: syncope    History   Social History  . Marital Status: Married    Spouse Name: N/A    Number of Children: N/A  . Years of Education: N/A   Occupational History  . Not on file.   Social History Main Topics  . Smoking status: Former Smoker    Quit date: 02/20/1982  . Smokeless tobacco: Not on file  . Alcohol Use: No  . Drug Use: Not on file  . Sexually Active: Not on file   Other Topics Concern  . Not on file   Social History Narrative   Retired  Family History  Problem Relation Age of Onset  . Hypertension Other     Physical Exam: Filed Vitals:   05/20/13 1544  BP: 114/69  Pulse: 80  Height: 6' (1.829 m)  Weight: 232 lb 12.8 oz (105.597 kg)    GEN- The patient is well appearing, alert and oriented x 3 today.   Head- normocephalic, atraumatic Eyes-  Sclera clear, conjunctiva pink Ears- hearing intact Oropharynx- clear Neck- supple, no JVP Lymph- no cervical lymphadenopathy Lungs- Clear to ausculation bilaterally, normal work of breathing Chest- pacemaker pocket is well healed Heart- Regular rate and rhythm, no murmurs, rubs or gallops, PMI not laterally displaced GI- soft, NT, ND, + BS Extremities- no clubbing, cyanosis, or edema, diminished DP/:PT pulses  Pacemaker interrogation- reviewed in detail today,  See PACEART report  Assessment and Plan:  1. Complete heart block Normal pacemaker function See Pace Art report No changes today  2. HTN Stable No change required today  Return in 6 months to the device clinic I will see again in 1 year

## 2013-05-20 NOTE — Patient Instructions (Addendum)
   6 months - Kristin   1 year - Allred Continue all current medications.  

## 2013-11-01 ENCOUNTER — Ambulatory Visit (INDEPENDENT_AMBULATORY_CARE_PROVIDER_SITE_OTHER): Payer: Medicare Other | Admitting: *Deleted

## 2013-11-01 DIAGNOSIS — I442 Atrioventricular block, complete: Secondary | ICD-10-CM

## 2013-11-01 LAB — PACEMAKER DEVICE OBSERVATION
AL AMPLITUDE: 2.8 mv
ATRIAL PACING PM: 68
BATTERY VOLTAGE: 2.73 V
RV LEAD THRESHOLD: 0.75 V
VENTRICULAR PACING PM: 100

## 2013-11-01 NOTE — Progress Notes (Signed)
Pacemaker check in clinic. Battery longevity 2 years. Normal device function. 25 mode switches---all less than 1 minute. No ventricular high rate episodes. Pt enrolled in [facility]. [facility]link 02-02-14 and ROV in May with [facility].

## 2013-11-01 NOTE — Progress Notes (Deleted)
Pacemaker check in clinic. Battery longevity 10 years. Pt in AF 9.4% of time. Longest episode was 2 hours 40 minutes. + Xarelto. 9 ventricular high rate episodes---AF w/RVR. Pt to be enrolled in [facility]. [facility] 02-02-14 and ROV in May with [facility].

## 2013-11-12 ENCOUNTER — Encounter: Payer: Self-pay | Admitting: Internal Medicine

## 2014-02-02 ENCOUNTER — Ambulatory Visit (INDEPENDENT_AMBULATORY_CARE_PROVIDER_SITE_OTHER): Payer: Medicare Other | Admitting: *Deleted

## 2014-02-02 DIAGNOSIS — I442 Atrioventricular block, complete: Secondary | ICD-10-CM

## 2014-02-02 LAB — MDC_IDC_ENUM_SESS_TYPE_REMOTE
Brady Statistic AS VP Percent: 34.1 %
Lead Channel Pacing Threshold Amplitude: 0.75 V
Lead Channel Pacing Threshold Amplitude: 0.75 V
Lead Channel Pacing Threshold Pulse Width: 0.4 ms
Lead Channel Sensing Intrinsic Amplitude: 2.8 mV
Lead Channel Setting Pacing Amplitude: 2.5 V
Lead Channel Setting Pacing Pulse Width: 0.4 ms
MDC IDC MSMT LEADCHNL RA IMPEDANCE VALUE: 443 Ohm
MDC IDC MSMT LEADCHNL RA PACING THRESHOLD PULSEWIDTH: 0.4 ms
MDC IDC MSMT LEADCHNL RV IMPEDANCE VALUE: 538 Ohm
MDC IDC SET LEADCHNL RA PACING AMPLITUDE: 2 V
MDC IDC SET LEADCHNL RV SENSING SENSITIVITY: 2.8 mV
MDC IDC STAT BRADY AP VP PERCENT: 65.9 %
MDC IDC STAT BRADY AP VS PERCENT: 0.1 %
MDC IDC STAT BRADY AS VS PERCENT: 0.1 %

## 2014-02-16 ENCOUNTER — Encounter: Payer: Self-pay | Admitting: *Deleted

## 2014-02-18 ENCOUNTER — Encounter: Payer: Self-pay | Admitting: Internal Medicine

## 2014-05-02 ENCOUNTER — Ambulatory Visit (INDEPENDENT_AMBULATORY_CARE_PROVIDER_SITE_OTHER): Payer: Medicare Other | Admitting: Internal Medicine

## 2014-05-02 ENCOUNTER — Encounter: Payer: Self-pay | Admitting: Internal Medicine

## 2014-05-02 VITALS — BP 112/71 | HR 74 | Ht 72.0 in | Wt 227.0 lb

## 2014-05-02 DIAGNOSIS — I10 Essential (primary) hypertension: Secondary | ICD-10-CM

## 2014-05-02 DIAGNOSIS — R42 Dizziness and giddiness: Secondary | ICD-10-CM

## 2014-05-02 DIAGNOSIS — I442 Atrioventricular block, complete: Secondary | ICD-10-CM

## 2014-05-02 DIAGNOSIS — R011 Cardiac murmur, unspecified: Secondary | ICD-10-CM

## 2014-05-02 DIAGNOSIS — Z950 Presence of cardiac pacemaker: Secondary | ICD-10-CM

## 2014-05-02 LAB — MDC_IDC_ENUM_SESS_TYPE_INCLINIC
Battery Voltage: 2.62 V
HIGH POWER IMPEDANCE MEASURED VALUE: 52 Ohm
HIGH POWER IMPEDANCE MEASURED VALUE: 54 Ohm
HIGH POWER IMPEDANCE MEASURED VALUE: 55 Ohm
HIGH POWER IMPEDANCE MEASURED VALUE: 57 Ohm
HIGH POWER IMPEDANCE MEASURED VALUE: 57 Ohm
HIGH POWER IMPEDANCE MEASURED VALUE: 59 Ohm
HIGH POWER IMPEDANCE MEASURED VALUE: 59 Ohm
HIGH POWER IMPEDANCE MEASURED VALUE: 59 Ohm
HIGH POWER IMPEDANCE MEASURED VALUE: 71 Ohm
HIGH POWER IMPEDANCE MEASURED VALUE: 71 Ohm
HIGH POWER IMPEDANCE MEASURED VALUE: 74 Ohm
HIGH POWER IMPEDANCE MEASURED VALUE: 77 Ohm
HIGH POWER IMPEDANCE MEASURED VALUE: 82 Ohm
HighPow Impedance: 51 Ohm
HighPow Impedance: 52 Ohm
HighPow Impedance: 55 Ohm
HighPow Impedance: 55 Ohm
HighPow Impedance: 56 Ohm
HighPow Impedance: 57 Ohm
HighPow Impedance: 59 Ohm
HighPow Impedance: 65 Ohm
HighPow Impedance: 72 Ohm
HighPow Impedance: 73 Ohm
HighPow Impedance: 73 Ohm
HighPow Impedance: 74 Ohm
HighPow Impedance: 76 Ohm
HighPow Impedance: 77 Ohm
HighPow Impedance: 78 Ohm
HighPow Impedance: 78 Ohm
HighPow Impedance: 78 Ohm
HighPow Impedance: 79 Ohm
Lead Channel Impedance Value: 496 Ohm
Lead Channel Impedance Value: 496 Ohm
Lead Channel Impedance Value: 504 Ohm
Lead Channel Impedance Value: 504 Ohm
Lead Channel Impedance Value: 520 Ohm
Lead Channel Impedance Value: 528 Ohm
Lead Channel Impedance Value: 528 Ohm
Lead Channel Impedance Value: 528 Ohm
Lead Channel Impedance Value: 536 Ohm
Lead Channel Pacing Threshold Amplitude: 0.75 V
Lead Channel Sensing Intrinsic Amplitude: 16.7 mV
Lead Channel Sensing Intrinsic Amplitude: 17.3 mV
Lead Channel Sensing Intrinsic Amplitude: 17.3 mV
Lead Channel Sensing Intrinsic Amplitude: 17.4 mV
Lead Channel Sensing Intrinsic Amplitude: 18 mV
Lead Channel Sensing Intrinsic Amplitude: 19.3 mV
Lead Channel Sensing Intrinsic Amplitude: 19.8 mV
Lead Channel Sensing Intrinsic Amplitude: 19.8 mV
Lead Channel Sensing Intrinsic Amplitude: 19.8 mV
MDC IDC MSMT LEADCHNL RA PACING THRESHOLD AMPLITUDE: 0.5 V
MDC IDC MSMT LEADCHNL RA PACING THRESHOLD PULSEWIDTH: 0.4 ms
MDC IDC MSMT LEADCHNL RA SENSING INTR AMPL: 2.8 mV
MDC IDC MSMT LEADCHNL RV IMPEDANCE VALUE: 488 Ohm
MDC IDC MSMT LEADCHNL RV IMPEDANCE VALUE: 496 Ohm
MDC IDC MSMT LEADCHNL RV IMPEDANCE VALUE: 504 Ohm
MDC IDC MSMT LEADCHNL RV IMPEDANCE VALUE: 504 Ohm
MDC IDC MSMT LEADCHNL RV IMPEDANCE VALUE: 512 Ohm
MDC IDC MSMT LEADCHNL RV IMPEDANCE VALUE: 512 Ohm
MDC IDC MSMT LEADCHNL RV PACING THRESHOLD PULSEWIDTH: 0.4 ms
MDC IDC MSMT LEADCHNL RV SENSING INTR AMPL: 17.9 mV
MDC IDC MSMT LEADCHNL RV SENSING INTR AMPL: 18.2 mV
MDC IDC MSMT LEADCHNL RV SENSING INTR AMPL: 18.7 mV
MDC IDC MSMT LEADCHNL RV SENSING INTR AMPL: 18.9 mV
MDC IDC MSMT LEADCHNL RV SENSING INTR AMPL: 19 mV
MDC IDC SESS DTM: 20150504091430
MDC IDC SET LEADCHNL RV PACING AMPLITUDE: 2.5 V
MDC IDC SET LEADCHNL RV PACING PULSEWIDTH: 0.4 ms
MDC IDC SET LEADCHNL RV SENSING SENSITIVITY: 0.45 mV
MDC IDC SET ZONE DETECTION INTERVAL: 250 ms
MDC IDC SET ZONE DETECTION INTERVAL: 270 ms
MDC IDC SET ZONE DETECTION INTERVAL: 290 ms
MDC IDC STAT BRADY RV PERCENT PACED: 0 %

## 2014-05-02 NOTE — Patient Instructions (Signed)
Your next [facility] device check is August 04, 2014. Your physician recommends that you schedule a follow-up appointment in: 1 year with Dr. Johney FrameAllred. You will receive a reminder letter in the mail in about 10 months reminding you to call and schedule your appointment. If you don't receive this letter, please contact our office. Your physician recommends that you continue on your current medications as directed. Please refer to the Current Medication list given to you today. Your physician has requested that you have an echocardiogram. Echocardiography is a painless test that uses sound waves to create images of your heart. It provides your doctor with information about the size and shape of your heart and how well your heart's chambers and valves are working. This procedure takes approximately one hour. There are no restrictions for this procedure.

## 2014-05-02 NOTE — Progress Notes (Signed)
PCP:  [name], [name], DO  The patient presents today for routine electrophysiology followup.  Since last being seen in our clinic, the patient reports doing very well.  His wife is recovering from surgery.  He remains less active than previously.  He hasnt played golf lately.  Today, he denies symptoms of palpitations, chest pain, shortness of breath, orthopnea, PND, lower extremity edema, presyncope, syncope, or neurologic sequela.  He has occasional dizziness in the evenings.  He describes this as postural.  The patient feels that he is tolerating medications without difficulties and is otherwise without complaint today.   Past Medical History  Diagnosis Date  . Malaise and fatigue   . Complete heart block     s/p PPM  . HTN (hypertension)   . Diabetes mellitus   . HL (hearing loss)    Past Surgical History  Procedure Laterality Date  . Hemorrhoid surgery    . Medtronic senia  12/04/07    PPM implantaed by [facility]    Current Outpatient Prescriptions  Medication Sig Dispense Refill  . amLODipine (NORVASC) 10 MG tablet Take 10 mg by mouth daily.        Marland Kitchen. aspirin (ASPIR-LOW) 81 MG EC tablet Take 81 mg by mouth daily.        . Cholecalciferol (VITAMIN D3) 5000 UNITS CAPS Take by mouth daily.        . Cyanocobalamin (VITAMIN B-12 PO) Take by mouth daily.      Marland Kitchen. doxazosin (CARDURA) 4 MG tablet Take 4 mg by mouth daily.        Marland Kitchen. escitalopram (LEXAPRO) 20 MG tablet Take 20 mg by mouth daily.      Marland Kitchen. gemfibrozil (LOPID) 600 MG tablet Take 600 mg by mouth 2 (two) times daily.       . insulin glargine (LANTUS) 100 UNIT/ML injection Inject 100 Units into the skin as directed.        Marland Kitchen. losartan (COZAAR) 100 MG tablet Take 100 mg by mouth daily.        . metFORMIN (GLUMETZA) 1000 MG (MOD) 24 hr tablet Take 1,000 mg by mouth 2 (two) times daily.        . Multiple Vitamins-Minerals (MULTIVITAL) tablet Take 1 tablet by mouth daily.        . Multiple Vitamins-Minerals (PRESERVISION AREDS) CAPS Take by  mouth 2 (two) times daily.        . travoprost, benzalkonium, (TRAVATAN Z) 0.004 % ophthalmic solution 1 drop as directed.        No current facility-administered medications for this visit.    Allergies  Allergen Reactions  . Aspirin     Upset stomach with full strength  . Codeine     REACTION: syncope    History   Social History  . Marital Status: Married    Spouse Name: N/A    Number of Children: N/A  . Years of Education: N/A   Occupational History  . Not on file.   Social History Main Topics  . Smoking status: Former Smoker    Quit date: 02/20/1982  . Smokeless tobacco: Not on file  . Alcohol Use: No  . Drug Use: Not on file  . Sexual Activity: Not on file   Other Topics Concern  . Not on file   Social History Narrative   Retired     Family History  Problem Relation Age of Onset  . Hypertension Other     Physical Exam: Filed Vitals:   05/02/14  0825  BP: 112/71  Pulse: 74  Height: 6' (1.829 m)  Weight: 227 lb (102.967 kg)    GEN- The patient is well appearing, alert and oriented x 3 today.   Head- normocephalic, atraumatic Eyes-  Sclera clear, conjunctiva pink Ears- hearing intact Oropharynx- clear Neck- supple, no JVP Lymph- no cervical lymphadenopathy Lungs- Clear to ausculation bilaterally, normal work of breathing Chest- pacemaker pocket is well healed Heart- Regular rate and rhythm, 2/6 SEM LUSB which is mid to late peaking GI- soft, NT, ND, + BS Extremities- no clubbing, cyanosis, or edema, diminished DP/:PT pulses  Pacemaker interrogation- reviewed in detail today,  See PACEART report  Assessment and Plan:  1. Complete heart block Normal pacemaker function See Pace Art report No changes today  2. HTN Stable No change required today  3. Murmur/ dizziness He likely has at least moderate AS by exam.  Given his dizziness, I will obtain an echo to further evaluate at this time.  carelink every 3 months I will see again in 1  year

## 2014-05-05 ENCOUNTER — Encounter: Payer: Self-pay | Admitting: Internal Medicine

## 2014-05-05 ENCOUNTER — Other Ambulatory Visit (INDEPENDENT_AMBULATORY_CARE_PROVIDER_SITE_OTHER): Payer: Medicare Other

## 2014-05-05 ENCOUNTER — Other Ambulatory Visit: Payer: Self-pay

## 2014-05-05 DIAGNOSIS — I10 Essential (primary) hypertension: Secondary | ICD-10-CM

## 2014-05-05 DIAGNOSIS — I442 Atrioventricular block, complete: Secondary | ICD-10-CM

## 2014-05-05 DIAGNOSIS — I359 Nonrheumatic aortic valve disorder, unspecified: Secondary | ICD-10-CM

## 2014-05-05 DIAGNOSIS — R011 Cardiac murmur, unspecified: Secondary | ICD-10-CM

## 2014-05-05 DIAGNOSIS — Z950 Presence of cardiac pacemaker: Secondary | ICD-10-CM

## 2014-05-12 ENCOUNTER — Telehealth: Payer: Self-pay | Admitting: *Deleted

## 2014-05-13 NOTE — Telephone Encounter (Signed)
Patient informed. 

## 2014-08-02 LAB — MDC_IDC_ENUM_SESS_TYPE_REMOTE
Battery Remaining Longevity: 21 mo
Brady Statistic AS VS Percent: 0 %
Lead Channel Impedance Value: 499 Ohm
Lead Channel Pacing Threshold Pulse Width: 0.4 ms
Lead Channel Pacing Threshold Pulse Width: 0.4 ms
Lead Channel Setting Pacing Amplitude: 2.5 V
Lead Channel Setting Sensing Sensitivity: 2 mV
MDC IDC MSMT BATTERY IMPEDANCE: 2064 Ohm
MDC IDC MSMT BATTERY VOLTAGE: 2.72 V
MDC IDC MSMT LEADCHNL RA IMPEDANCE VALUE: 379 Ohm
MDC IDC MSMT LEADCHNL RA PACING THRESHOLD AMPLITUDE: 0.625 V
MDC IDC MSMT LEADCHNL RA SENSING INTR AMPL: 1.4 mV
MDC IDC MSMT LEADCHNL RV PACING THRESHOLD AMPLITUDE: 0.625 V
MDC IDC SESS DTM: 20150804113015
MDC IDC SET LEADCHNL RA PACING AMPLITUDE: 2 V
MDC IDC SET LEADCHNL RV PACING PULSEWIDTH: 0.4 ms
MDC IDC STAT BRADY AP VP PERCENT: 73 %
MDC IDC STAT BRADY AP VS PERCENT: 0 %
MDC IDC STAT BRADY AS VP PERCENT: 27 %

## 2014-08-04 ENCOUNTER — Ambulatory Visit (INDEPENDENT_AMBULATORY_CARE_PROVIDER_SITE_OTHER): Payer: Medicare Other | Admitting: *Deleted

## 2014-08-04 DIAGNOSIS — I442 Atrioventricular block, complete: Secondary | ICD-10-CM

## 2014-08-08 NOTE — Progress Notes (Signed)
Remote pacemaker transmission.   

## 2014-08-11 ENCOUNTER — Encounter: Payer: Self-pay | Admitting: Cardiology

## 2014-08-15 ENCOUNTER — Encounter: Payer: Self-pay | Admitting: Internal Medicine

## 2014-11-03 ENCOUNTER — Telehealth: Payer: Self-pay | Admitting: Cardiology

## 2014-11-03 ENCOUNTER — Ambulatory Visit (INDEPENDENT_AMBULATORY_CARE_PROVIDER_SITE_OTHER): Payer: Medicare Other | Admitting: *Deleted

## 2014-11-03 DIAGNOSIS — I442 Atrioventricular block, complete: Secondary | ICD-10-CM

## 2014-11-03 NOTE — Telephone Encounter (Signed)
Spoke with pt and reminded pt of remote transmission that is due today. Pt verbalized understanding.   

## 2014-11-03 NOTE — Progress Notes (Signed)
Remote pacemaker transmission.   

## 2014-11-05 LAB — MDC_IDC_ENUM_SESS_TYPE_REMOTE
Battery Impedance: 2699 Ohm
Battery Remaining Longevity: 16 mo
Battery Voltage: 2.69 V
Brady Statistic AP VP Percent: 72 %
Brady Statistic AP VS Percent: 0 %
Brady Statistic AS VP Percent: 28 %
Date Time Interrogation Session: 20151105185740
Lead Channel Pacing Threshold Amplitude: 0.5 V
Lead Channel Pacing Threshold Pulse Width: 0.4 ms
Lead Channel Sensing Intrinsic Amplitude: 1.4 mV
Lead Channel Setting Pacing Amplitude: 2.5 V
Lead Channel Setting Pacing Pulse Width: 0.4 ms
Lead Channel Setting Sensing Sensitivity: 2 mV
MDC IDC MSMT LEADCHNL RA IMPEDANCE VALUE: 392 Ohm
MDC IDC MSMT LEADCHNL RA PACING THRESHOLD PULSEWIDTH: 0.4 ms
MDC IDC MSMT LEADCHNL RV IMPEDANCE VALUE: 570 Ohm
MDC IDC MSMT LEADCHNL RV PACING THRESHOLD AMPLITUDE: 0.75 V
MDC IDC SET LEADCHNL RA PACING AMPLITUDE: 2 V
MDC IDC STAT BRADY AS VS PERCENT: 0 %

## 2014-11-17 ENCOUNTER — Encounter: Payer: Self-pay | Admitting: Cardiology

## 2014-11-28 ENCOUNTER — Encounter: Payer: Self-pay | Admitting: Internal Medicine

## 2015-02-06 ENCOUNTER — Ambulatory Visit (INDEPENDENT_AMBULATORY_CARE_PROVIDER_SITE_OTHER): Payer: Medicare Other | Admitting: *Deleted

## 2015-02-06 ENCOUNTER — Encounter: Payer: Self-pay | Admitting: Internal Medicine

## 2015-02-06 DIAGNOSIS — I442 Atrioventricular block, complete: Secondary | ICD-10-CM

## 2015-02-06 LAB — MDC_IDC_ENUM_SESS_TYPE_REMOTE
Battery Impedance: 2995 Ohm
Brady Statistic AP VS Percent: 0 %
Brady Statistic AS VP Percent: 29 %
Date Time Interrogation Session: 20160208131123
Lead Channel Impedance Value: 381 Ohm
Lead Channel Impedance Value: 497 Ohm
Lead Channel Pacing Threshold Amplitude: 0.75 V
Lead Channel Pacing Threshold Amplitude: 0.75 V
Lead Channel Pacing Threshold Pulse Width: 0.4 ms
Lead Channel Pacing Threshold Pulse Width: 0.4 ms
Lead Channel Setting Pacing Amplitude: 2 V
Lead Channel Setting Pacing Amplitude: 2.5 V
Lead Channel Setting Pacing Pulse Width: 0.4 ms
Lead Channel Setting Sensing Sensitivity: 2 mV
MDC IDC MSMT BATTERY REMAINING LONGEVITY: 13 mo
MDC IDC MSMT BATTERY VOLTAGE: 2.69 V
MDC IDC MSMT LEADCHNL RA SENSING INTR AMPL: 1.4 mV
MDC IDC STAT BRADY AP VP PERCENT: 71 %
MDC IDC STAT BRADY AS VS PERCENT: 0 %

## 2015-02-06 NOTE — Progress Notes (Signed)
Remote pacemaker transmission.   

## 2015-02-16 ENCOUNTER — Encounter: Payer: Self-pay | Admitting: Cardiology

## 2015-05-12 ENCOUNTER — Ambulatory Visit (INDEPENDENT_AMBULATORY_CARE_PROVIDER_SITE_OTHER): Payer: Medicare Other | Admitting: Internal Medicine

## 2015-05-12 ENCOUNTER — Encounter: Payer: Self-pay | Admitting: Internal Medicine

## 2015-05-12 VITALS — BP 118/70 | HR 72 | Ht 72.0 in | Wt 223.0 lb

## 2015-05-12 DIAGNOSIS — I442 Atrioventricular block, complete: Secondary | ICD-10-CM | POA: Diagnosis not present

## 2015-05-12 DIAGNOSIS — Z950 Presence of cardiac pacemaker: Secondary | ICD-10-CM

## 2015-05-12 DIAGNOSIS — I10 Essential (primary) hypertension: Secondary | ICD-10-CM

## 2015-05-12 DIAGNOSIS — I350 Nonrheumatic aortic (valve) stenosis: Secondary | ICD-10-CM

## 2015-05-12 LAB — CUP PACEART INCLINIC DEVICE CHECK
Brady Statistic AP VP Percent: 69 %
Brady Statistic AP VS Percent: 0 %
Brady Statistic AS VS Percent: 0 %
Lead Channel Impedance Value: 385 Ohm
Lead Channel Impedance Value: 478 Ohm
Lead Channel Pacing Threshold Amplitude: 0.75 V
Lead Channel Pacing Threshold Pulse Width: 0.4 ms
Lead Channel Sensing Intrinsic Amplitude: 2 mV
Lead Channel Setting Pacing Amplitude: 2 V
Lead Channel Setting Sensing Sensitivity: 2 mV
MDC IDC MSMT BATTERY IMPEDANCE: 3443 Ohm
MDC IDC MSMT BATTERY REMAINING LONGEVITY: 11 mo
MDC IDC MSMT BATTERY VOLTAGE: 2.68 V
MDC IDC MSMT LEADCHNL RA PACING THRESHOLD AMPLITUDE: 0.5 V
MDC IDC MSMT LEADCHNL RV PACING THRESHOLD PULSEWIDTH: 0.4 ms
MDC IDC SESS DTM: 20160513095614
MDC IDC SET LEADCHNL RV PACING AMPLITUDE: 2.5 V
MDC IDC SET LEADCHNL RV PACING PULSEWIDTH: 0.4 ms
MDC IDC STAT BRADY AS VP PERCENT: 31 %

## 2015-05-12 NOTE — Patient Instructions (Signed)
Your physician recommends that you continue on your current medications as directed. Please refer to the Current Medication list given to you today. Carelink device check on 06/12/15. Your physician recommends that you schedule a follow-up appointment in: 1 year with D. Allred. You will receive a reminder letter in the mail in about 10 months reminding you to call and schedule your appointment. If you don't receive this letter, please contact our office.

## 2015-05-12 NOTE — Progress Notes (Signed)
Electrophysiology Office Note   Date:  05/12/2015   ID:  [name], DOB 05-28-1933, MRN [id number]  PCP:  [name]   Primary Electrophysiologist: Frank RangeJames Kerrie Timm, MD    Chief Complaint  Patient presents with  . Fatigue     History of Present Illness: [name] is a 81 y.o. male who presents today for electrophysiology evaluation.   His primary concern is with macular degeneration.  He continues to slow down.  He has some dementia but continues to drive and function reasonably well.  He played golf in Feb with his sons.  He has stable fatigue. Today, he denies symptoms of palpitations, chest pain, shortness of breath, orthopnea, PND, lower extremity edema, claudication, dizziness, presyncope, syncope, bleeding, or neurologic sequela. The patient is tolerating medications without difficulties and is otherwise without complaint today.    Past Medical History  Diagnosis Date  . Malaise and fatigue   . Complete heart block     s/p PPM  . HTN (hypertension)   . Diabetes mellitus   . HL (hearing loss)   . Aortic stenosis     mild   Past Surgical History  Procedure Laterality Date  . Hemorrhoid surgery    . Medtronic senia  12/04/07    PPM implantaed by [name]     Current Outpatient Prescriptions  Medication Sig Dispense Refill  . amLODipine (NORVASC) 10 MG tablet Take 10 mg by mouth daily.      Marland Kitchen. aspirin (ASPIR-LOW) 81 MG EC tablet Take 81 mg by mouth daily.      . Cholecalciferol (VITAMIN D3) 5000 UNITS CAPS Take by mouth daily.      . Cyanocobalamin (VITAMIN B-12 PO) Take by mouth daily.    Marland Kitchen. doxazosin (CARDURA) 4 MG tablet Take 4 mg by mouth daily.      Marland Kitchen. escitalopram (LEXAPRO) 20 MG tablet Take 20 mg by mouth daily.    Marland Kitchen. gemfibrozil (LOPID) 600 MG tablet Take 600 mg by mouth 2 (two) times daily.     . insulin glargine (LANTUS) 100 UNIT/ML injection Inject 100 Units into the skin as directed.      Marland Kitchen. losartan (COZAAR) 100 MG tablet Take 100 mg by  mouth daily.      . metFORMIN (GLUMETZA) 1000 MG (MOD) 24 hr tablet Take 1,000 mg by mouth 2 (two) times daily.      . Multiple Vitamins-Minerals (MULTIVITAL) tablet Take 1 tablet by mouth daily.      . Multiple Vitamins-Minerals (PRESERVISION AREDS) CAPS Take by mouth 2 (two) times daily.      . travoprost, benzalkonium, (TRAVATAN Z) 0.004 % ophthalmic solution 1 drop as directed.      No current facility-administered medications for this visit.    Allergies:   Aspirin and Codeine   Social History:  The patient  reports that he quit smoking about 33 years ago. He has never used smokeless tobacco. He reports that he does not drink alcohol.   Family History:  The patient's family history includes Hypertension in his other.    ROS:  Please see the history of present illness.   All other systems are reviewed and negative.    PHYSICAL EXAM: VS:  BP 118/70 mmHg  Pulse 72  Ht 6' (1.829 m)  Wt 223 lb (101.152 kg)  BMI 30.24 kg/m2  SpO2 93% , BMI Body mass index is 30.24 kg/(m^2). GEN: Well nourished, well developed, in no acute distress HEENT: normal Neck:  no JVD, carotid bruits, or masses Cardiac: RRR; 2/6 SEM LUSB (Early peaking) Respiratory:  clear to auscultation bilaterally, normal work of breathing GI: soft, nontender, nondistended, + BS MS: no deformity or atrophy Skin: warm and dry, device pocket is well healed Neuro:  Strength and sensation are intact Psych: euthymic mood, full affect  Device interrogation is reviewed today in detail.  See PaceArt for details.   Recent Labs: No results found for requested labs within last 365 days.    Lipid Panel  No results found for: CHOL, TRIG, HDL, CHOLHDL, VLDL, LDLCALC, LDLDIRECT   Wt Readings from Last 3 Encounters:  05/12/15 223 lb (101.152 kg)  05/02/14 227 lb (102.967 kg)  05/20/13 232 lb 12.8 oz (105.597 kg)     ASSESSMENT AND PLAN:  1.  Complete heart block Normal pacemaker function See Pace Art report No  changes today Nearing ERI (11 months) Monthly carelink  2. Mild AS Stable No change required today  3. HTN Stable No change required today  4. HL Stable No change required today   Current medicines are reviewed at length with the patient today.   The patient does not have concerns regarding his medicines.  The following changes were made today:  none  Labs/ tests ordered today include:  Orders Placed This Encounter  Procedures  . Implantable device check    Follow-up:  Monthly carelink, return to see me in 1 year  Signed, Frank RangeJames Nolyn Eilert, MD  05/12/2015 10:16 AM     [facility] [address] [place] [address] [phone] (office) [phone] (fax)

## 2015-06-12 ENCOUNTER — Ambulatory Visit (INDEPENDENT_AMBULATORY_CARE_PROVIDER_SITE_OTHER): Payer: Medicare Other | Admitting: *Deleted

## 2015-06-12 DIAGNOSIS — Z950 Presence of cardiac pacemaker: Secondary | ICD-10-CM

## 2015-06-12 NOTE — Progress Notes (Signed)
Remote pacemaker transmission.   

## 2015-06-13 ENCOUNTER — Encounter: Payer: Self-pay | Admitting: Internal Medicine

## 2015-06-16 LAB — CUP PACEART REMOTE DEVICE CHECK
Battery Impedance: 3737 Ohm
Battery Remaining Longevity: 9 mo
Battery Voltage: 2.66 V
Brady Statistic AP VS Percent: 0 %
Brady Statistic AS VS Percent: 0 %
Date Time Interrogation Session: 20160613133748
Lead Channel Impedance Value: 490 Ohm
Lead Channel Pacing Threshold Amplitude: 0.625 V
Lead Channel Pacing Threshold Amplitude: 0.875 V
Lead Channel Pacing Threshold Pulse Width: 0.4 ms
Lead Channel Pacing Threshold Pulse Width: 0.4 ms
Lead Channel Setting Pacing Amplitude: 2 V
Lead Channel Setting Pacing Amplitude: 2.5 V
Lead Channel Setting Pacing Pulse Width: 0.4 ms
Lead Channel Setting Sensing Sensitivity: 2 mV
MDC IDC MSMT LEADCHNL RA IMPEDANCE VALUE: 389 Ohm
MDC IDC MSMT LEADCHNL RA SENSING INTR AMPL: 1.4 mV
MDC IDC STAT BRADY AP VP PERCENT: 67 %
MDC IDC STAT BRADY AS VP PERCENT: 33 %

## 2015-06-22 ENCOUNTER — Encounter: Payer: Self-pay | Admitting: Cardiology

## 2015-06-28 ENCOUNTER — Encounter: Payer: Self-pay | Admitting: Internal Medicine

## 2015-07-17 ENCOUNTER — Ambulatory Visit (INDEPENDENT_AMBULATORY_CARE_PROVIDER_SITE_OTHER): Payer: Medicare Other | Admitting: *Deleted

## 2015-07-17 DIAGNOSIS — Z950 Presence of cardiac pacemaker: Secondary | ICD-10-CM

## 2015-07-17 NOTE — Progress Notes (Signed)
Remote pacemaker transmission.   

## 2015-07-18 LAB — CUP PACEART REMOTE DEVICE CHECK
Brady Statistic AP VP Percent: 69 %
Brady Statistic AP VS Percent: 0 %
Brady Statistic AS VS Percent: 0 %
Lead Channel Impedance Value: 561 Ohm
Lead Channel Setting Pacing Pulse Width: 0.4 ms
Lead Channel Setting Sensing Sensitivity: 2 mV
MDC IDC MSMT BATTERY IMPEDANCE: 4143 Ohm
MDC IDC MSMT BATTERY REMAINING LONGEVITY: 7 mo
MDC IDC MSMT BATTERY VOLTAGE: 2.65 V
MDC IDC MSMT LEADCHNL RA IMPEDANCE VALUE: 382 Ohm
MDC IDC SESS DTM: 20160718123410
MDC IDC SET LEADCHNL RA PACING AMPLITUDE: 2 V
MDC IDC SET LEADCHNL RV PACING AMPLITUDE: 2.5 V
MDC IDC STAT BRADY AS VP PERCENT: 31 %

## 2015-08-04 ENCOUNTER — Encounter: Payer: Self-pay | Admitting: Cardiology

## 2015-08-04 ENCOUNTER — Encounter: Payer: Self-pay | Admitting: Internal Medicine

## 2015-08-17 ENCOUNTER — Ambulatory Visit (INDEPENDENT_AMBULATORY_CARE_PROVIDER_SITE_OTHER): Payer: Medicare Other | Admitting: *Deleted

## 2015-08-17 DIAGNOSIS — I442 Atrioventricular block, complete: Secondary | ICD-10-CM | POA: Diagnosis not present

## 2015-08-17 NOTE — Progress Notes (Signed)
Remote pacemaker transmission.   

## 2015-08-21 ENCOUNTER — Encounter: Payer: Self-pay | Admitting: Cardiology

## 2015-08-25 LAB — CUP PACEART REMOTE DEVICE CHECK
Battery Impedance: 4650 Ohm
Battery Remaining Longevity: 5 mo
Battery Voltage: 2.63 V
Brady Statistic AP VP Percent: 70 %
Brady Statistic AP VS Percent: 0 %
Brady Statistic AS VP Percent: 30 %
Brady Statistic AS VS Percent: 0 %
Date Time Interrogation Session: 20160818132732
Lead Channel Impedance Value: 403 Ohm
Lead Channel Impedance Value: 492 Ohm
Lead Channel Pacing Threshold Amplitude: 0.75 V
Lead Channel Pacing Threshold Amplitude: 0.875 V
Lead Channel Pacing Threshold Pulse Width: 0.4 ms
Lead Channel Pacing Threshold Pulse Width: 0.4 ms
Lead Channel Sensing Intrinsic Amplitude: 1.4 mV
Lead Channel Setting Pacing Amplitude: 2 V
Lead Channel Setting Pacing Amplitude: 2.5 V
Lead Channel Setting Pacing Pulse Width: 0.4 ms
Lead Channel Setting Sensing Sensitivity: 2 mV

## 2015-08-28 ENCOUNTER — Telehealth: Payer: Self-pay | Admitting: Internal Medicine

## 2015-08-28 ENCOUNTER — Ambulatory Visit: Payer: Medicare Other | Admitting: *Deleted

## 2015-08-28 DIAGNOSIS — I442 Atrioventricular block, complete: Secondary | ICD-10-CM

## 2015-08-28 NOTE — Telephone Encounter (Signed)
New message      Pt received letter about transmission not being received

## 2015-08-29 NOTE — Progress Notes (Signed)
Remote pacemaker transmission.   

## 2015-08-29 NOTE — Telephone Encounter (Signed)
Returned patient's call and advised that we did receive his remote transmission and that the letter he received was sent in error.  Patient voiced understanding and denied additional questions or concerns at this time.

## 2015-08-31 ENCOUNTER — Encounter: Payer: Self-pay | Admitting: Cardiology

## 2015-09-12 ENCOUNTER — Encounter: Payer: Self-pay | Admitting: Internal Medicine

## 2015-09-18 ENCOUNTER — Ambulatory Visit (INDEPENDENT_AMBULATORY_CARE_PROVIDER_SITE_OTHER): Payer: Medicare Other | Admitting: *Deleted

## 2015-09-18 DIAGNOSIS — Z950 Presence of cardiac pacemaker: Secondary | ICD-10-CM

## 2015-09-20 LAB — CUP PACEART REMOTE DEVICE CHECK
Battery Impedance: 5337 Ohm
Battery Voltage: 2.6 V
Date Time Interrogation Session: 20160919123829
Lead Channel Impedance Value: 492 Ohm
Lead Channel Setting Sensing Sensitivity: 2 mV
MDC IDC MSMT BATTERY REMAINING LONGEVITY: 2 mo
MDC IDC MSMT LEADCHNL RA IMPEDANCE VALUE: 421 Ohm
MDC IDC SET LEADCHNL RA PACING AMPLITUDE: 2 V
MDC IDC SET LEADCHNL RV PACING AMPLITUDE: 2.5 V
MDC IDC SET LEADCHNL RV PACING PULSEWIDTH: 0.4 ms
MDC IDC STAT BRADY AP VP PERCENT: 71 %
MDC IDC STAT BRADY AP VS PERCENT: 0 %
MDC IDC STAT BRADY AS VP PERCENT: 29 %
MDC IDC STAT BRADY AS VS PERCENT: 0 %

## 2015-09-20 NOTE — Progress Notes (Signed)
Duplicate charge. Was charged 08/17/15.

## 2015-09-20 NOTE — Progress Notes (Signed)
Remote pacemaker transmission.   

## 2015-09-21 ENCOUNTER — Encounter: Payer: Self-pay | Admitting: Internal Medicine

## 2015-09-26 ENCOUNTER — Encounter: Payer: Self-pay | Admitting: Cardiology

## 2015-10-03 ENCOUNTER — Encounter: Payer: Self-pay | Admitting: Internal Medicine

## 2015-10-19 ENCOUNTER — Telehealth: Payer: Self-pay | Admitting: *Deleted

## 2015-10-19 ENCOUNTER — Ambulatory Visit (INDEPENDENT_AMBULATORY_CARE_PROVIDER_SITE_OTHER): Payer: Medicare Other | Admitting: *Deleted

## 2015-10-19 DIAGNOSIS — Z950 Presence of cardiac pacemaker: Secondary | ICD-10-CM

## 2015-10-19 LAB — CUP PACEART REMOTE DEVICE CHECK
Battery Remaining Longevity: 4 mo
Date Time Interrogation Session: 20161020112846
Implantable Lead Location: 753859
Implantable Lead Model: 5076
Implantable Lead Model: 5076
Lead Channel Impedance Value: 464 Ohm
Lead Channel Impedance Value: 67 Ohm
Lead Channel Setting Pacing Amplitude: 2.5 V
MDC IDC LEAD IMPLANT DT: 20081205
MDC IDC LEAD IMPLANT DT: 20081205
MDC IDC LEAD LOCATION: 753860
MDC IDC MSMT BATTERY IMPEDANCE: 6134 Ohm
MDC IDC MSMT BATTERY VOLTAGE: 2.62 V
MDC IDC SET LEADCHNL RV PACING PULSEWIDTH: 0.4 ms
MDC IDC SET LEADCHNL RV SENSING SENSITIVITY: 2 mV
MDC IDC STAT BRADY RV PERCENT PACED: 100 %

## 2015-10-19 NOTE — Telephone Encounter (Signed)
Transmission received. Pacer ERI as of 09/25/15. Pt called to make aware. Will have scheduler call to arrange appt with EP to discuss generator replacement.  Pt reports he is SOB with activity- notified that this is because of programming change due to battery replacement indicator. Will have sooner appt made.

## 2015-10-19 NOTE — Progress Notes (Signed)
Remote pacemaker transmission.   

## 2015-11-03 ENCOUNTER — Encounter: Payer: Self-pay | Admitting: Internal Medicine

## 2015-11-03 ENCOUNTER — Telehealth: Payer: Self-pay | Admitting: Internal Medicine

## 2015-11-03 ENCOUNTER — Encounter: Payer: Self-pay | Admitting: *Deleted

## 2015-11-03 ENCOUNTER — Ambulatory Visit (INDEPENDENT_AMBULATORY_CARE_PROVIDER_SITE_OTHER): Payer: Medicare Other | Admitting: Internal Medicine

## 2015-11-03 VITALS — BP 130/62 | HR 65 | Ht 72.0 in | Wt 224.0 lb

## 2015-11-03 DIAGNOSIS — I442 Atrioventricular block, complete: Secondary | ICD-10-CM | POA: Diagnosis not present

## 2015-11-03 DIAGNOSIS — Z01810 Encounter for preprocedural cardiovascular examination: Secondary | ICD-10-CM

## 2015-11-03 DIAGNOSIS — Z950 Presence of cardiac pacemaker: Secondary | ICD-10-CM | POA: Diagnosis not present

## 2015-11-03 DIAGNOSIS — I10 Essential (primary) hypertension: Secondary | ICD-10-CM | POA: Diagnosis not present

## 2015-11-03 DIAGNOSIS — I350 Nonrheumatic aortic (valve) stenosis: Secondary | ICD-10-CM

## 2015-11-03 NOTE — Telephone Encounter (Signed)
Auth # [id number] exp 12/18/15

## 2015-11-03 NOTE — Telephone Encounter (Signed)
Pacemaker generator change out on 11/14/15 @1 :30 pm with Dr. Johney FrameAllred. Dx: ERI Checking percert

## 2015-11-03 NOTE — Patient Instructions (Signed)
Your physician recommends that you continue on your current medications as directed. Please refer to the Current Medication list given to you today. You are scheduled for your pacemaker device change out on 11/14/15 @1 :30 pm. Please refer to the instruction sheet given to you today for details regarding this procedure.

## 2015-11-05 NOTE — Progress Notes (Signed)
   Electrophysiology Office Note   Date:  11/05/2015   ID:  Rockne O Papillion, DOB 02/05/1933, MRN [id number]  PCP:  BUTLER, [name], DO   Primary Electrophysiologist: Sathvika Ojo, MD    CC: SOB   History of Present Illness: [name] is a 81 y.o. male who presents today for electrophysiology evaluation.   His primary concern is with macular degeneration.  He continues to slow down.  He has some dementia. HIs pacemaker has reached ERI.  THis is accompanied by some slight increase in SOB.  Today, he denies symptoms of palpitations, chest pain,  orthopnea, PND, lower extremity edema, claudication, dizziness, presyncope, syncope, bleeding, or neurologic sequela. The patient is tolerating medications without difficulties and is otherwise without complaint today.    Past Medical History  Diagnosis Date  . Malaise and fatigue   . Complete heart block (HCC)     s/p PPM  . HTN (hypertension)   . Diabetes mellitus   . HL (hearing loss)   . Aortic stenosis     mild   Past Surgical History  Procedure Laterality Date  . Hemorrhoid surgery    . Medtronic senia  12/04/07    PPM implantaed by [facility]     Current Outpatient Prescriptions  Medication Sig Dispense Refill  . amLODipine (NORVASC) 10 MG tablet Take 10 mg by mouth daily.      . aspirin (ASPIR-LOW) 81 MG EC tablet Take 81 mg by mouth daily.      . Cholecalciferol (VITAMIN D3) 5000 UNITS CAPS Take by mouth daily.      . Cyanocobalamin (VITAMIN B-12 PO) Take by mouth daily.    . doxazosin (CARDURA) 4 MG tablet Take 4 mg by mouth daily.      . escitalopram (LEXAPRO) 20 MG tablet Take 20 mg by mouth daily.    . insulin glargine (LANTUS) 100 UNIT/ML injection Inject 100 Units into the skin as directed.      . losartan (COZAAR) 100 MG tablet Take 100 mg by mouth daily.      . metFORMIN (GLUMETZA) 1000 MG (MOD) 24 hr tablet Take 1,000 mg by mouth 2 (two) times daily.      . Multiple Vitamins-Minerals (MULTIVITAL) tablet Take 1  tablet by mouth daily.      . Multiple Vitamins-Minerals (PRESERVISION AREDS) CAPS Take by mouth 2 (two) times daily.      . simvastatin (ZOCOR) 20 MG tablet Take 20 mg by mouth daily.    . travoprost, benzalkonium, (TRAVATAN Z) 0.004 % ophthalmic solution 1 drop as directed.      No current facility-administered medications for this visit.    Allergies:   Aspirin and Codeine   Social History:  The patient  reports that he quit smoking about 33 years ago. He has never used smokeless tobacco. He reports that he does not drink alcohol.   Family History:  The patient's family history includes Hypertension in his other.    ROS:  Please see the history of present illness.   All other systems are reviewed and negative.    PHYSICAL EXAM: VS:  BP 130/62 mmHg  Pulse 65  Ht 6' (1.829 m)  Wt 224 lb (101.606 kg)  BMI 30.37 kg/m2  SpO2 98% , BMI Body mass index is 30.37 kg/(m^2). GEN: Well nourished, well developed, in no acute distress HEENT: normal Neck: no JVD, carotid bruits, or masses Cardiac: RRR; 2/6 SEM LUSB (Early peaking) Respiratory:  clear to auscultation bilaterally, normal   work of breathing GI: soft, nontender, nondistended, + BS MS: no deformity or atrophy Skin: warm and dry, device pocket is well healed Neuro:  Strength and sensation are intact Psych: euthymic mood, full affect  Device interrogation is reviewed today in detail.  See PaceArt for details.   Recent Labs: No results found for requested labs within last 365 days.    Lipid Panel  No results found for: CHOL, TRIG, HDL, CHOLHDL, VLDL, LDLCALC, LDLDIRECT   Wt Readings from Last 3 Encounters:  11/03/15 224 lb (101.606 kg)  05/12/15 223 lb (101.152 kg)  05/02/14 227 lb (102.967 kg)     ASSESSMENT AND PLAN:  1.  Complete heart block He has reached ERI battery status Risks, benefits, and alternatives to pacemaker pulse generator replacement were discussed in detail today.  The patient understands that  risks include but are not limited to bleeding, infection, pneumothorax, perforation, tamponade, vascular damage, renal failure, MI, stroke, death,  damage to his existing leads, and lead dislodgement and wishes to proceed.  We will therefore schedule the procedure at the next available time.  2. Mild AS Stable No change required today Less echo was 1 year ago Repeat echo if symptoms worsen  3. HTN Stable No change required today  4. HL Stable No change required today   Current medicines are reviewed at length with the patient today.   The patient does not have concerns regarding his medicines.  The following changes were made today:  none  Labs/ tests ordered today include:  Orders Placed This Encounter  Procedures  . Basic metabolic panel  . CBC  . APTT  . Protime-INR      Signed, Hillis RangeJames Clay Menser, MD  11/05/2015 8:10 AM     [facility] [address] [place] [address] [phone] (office) [phone] (fax)

## 2015-11-06 LAB — CUP PACEART INCLINIC DEVICE CHECK
Implantable Lead Implant Date: 20081205
Lead Channel Setting Pacing Pulse Width: 0.4 ms
Lead Channel Setting Sensing Sensitivity: 2 mV
MDC IDC LEAD IMPLANT DT: 20081205
MDC IDC LEAD LOCATION: 753859
MDC IDC LEAD LOCATION: 753860
MDC IDC MSMT BATTERY IMPEDANCE: 6407 Ohm
MDC IDC MSMT BATTERY VOLTAGE: 2.61 V
MDC IDC SESS DTM: 20161107105448
MDC IDC SET LEADCHNL RV PACING AMPLITUDE: 2.5 V
MDC IDC STAT BRADY RV PERCENT PACED: 99.8 %

## 2015-11-07 LAB — PROTIME-INR

## 2015-11-09 ENCOUNTER — Encounter: Payer: Self-pay | Admitting: *Deleted

## 2015-11-13 ENCOUNTER — Telehealth: Payer: Self-pay | Admitting: *Deleted

## 2015-11-13 ENCOUNTER — Encounter: Payer: Self-pay | Admitting: Internal Medicine

## 2015-11-13 NOTE — Telephone Encounter (Signed)
Patient informed. 

## 2015-11-14 ENCOUNTER — Ambulatory Visit (HOSPITAL_COMMUNITY)
Admission: RE | Admit: 2015-11-14 | Discharge: 2015-11-14 | Disposition: A | Discharge status: Home / Self Care | Payer: Medicare Other | Source: Ambulatory Visit | Attending: Internal Medicine | Admitting: Internal Medicine

## 2015-11-14 ENCOUNTER — Encounter (HOSPITAL_COMMUNITY)
Admission: RE | Disposition: A | Discharge status: Home / Self Care | Payer: Self-pay | Source: Ambulatory Visit | Attending: Internal Medicine

## 2015-11-14 DIAGNOSIS — R001 Bradycardia, unspecified: Secondary | ICD-10-CM | POA: Insufficient documentation

## 2015-11-14 DIAGNOSIS — Z7984 Long term (current) use of oral hypoglycemic drugs: Secondary | ICD-10-CM | POA: Diagnosis not present

## 2015-11-14 DIAGNOSIS — Z794 Long term (current) use of insulin: Secondary | ICD-10-CM | POA: Insufficient documentation

## 2015-11-14 DIAGNOSIS — E785 Hyperlipidemia, unspecified: Secondary | ICD-10-CM | POA: Insufficient documentation

## 2015-11-14 DIAGNOSIS — Z950 Presence of cardiac pacemaker: Secondary | ICD-10-CM | POA: Diagnosis present

## 2015-11-14 DIAGNOSIS — I10 Essential (primary) hypertension: Secondary | ICD-10-CM | POA: Insufficient documentation

## 2015-11-14 DIAGNOSIS — H3530 Unspecified macular degeneration: Secondary | ICD-10-CM | POA: Insufficient documentation

## 2015-11-14 DIAGNOSIS — F0390 Unspecified dementia without behavioral disturbance: Secondary | ICD-10-CM | POA: Diagnosis not present

## 2015-11-14 DIAGNOSIS — Z87891 Personal history of nicotine dependence: Secondary | ICD-10-CM | POA: Insufficient documentation

## 2015-11-14 DIAGNOSIS — I442 Atrioventricular block, complete: Secondary | ICD-10-CM | POA: Diagnosis present

## 2015-11-14 DIAGNOSIS — Z45010 Encounter for checking and testing of cardiac pacemaker pulse generator [battery]: Secondary | ICD-10-CM | POA: Diagnosis not present

## 2015-11-14 DIAGNOSIS — Z7982 Long term (current) use of aspirin: Secondary | ICD-10-CM | POA: Diagnosis not present

## 2015-11-14 DIAGNOSIS — E119 Type 2 diabetes mellitus without complications: Secondary | ICD-10-CM | POA: Diagnosis not present

## 2015-11-14 HISTORY — PX: EP IMPLANTABLE DEVICE: SHX172B

## 2015-11-14 LAB — GLUCOSE, CAPILLARY: GLUCOSE-CAPILLARY: 122 mg/dL — AB (ref 65–99)

## 2015-11-14 LAB — SURGICAL PCR SCREEN
MRSA, PCR: NEGATIVE
Staphylococcus aureus: NEGATIVE

## 2015-11-14 SURGERY — PPM/BIV PPM GENERATOR CHANGEOUT
Anesthesia: LOCAL

## 2015-11-14 MED ORDER — FENTANYL CITRATE (PF) 100 MCG/2ML IJ SOLN
INTRAMUSCULAR | Status: AC
  Filled 2015-11-14: qty 4

## 2015-11-14 MED ORDER — MIDAZOLAM HCL 5 MG/5ML IJ SOLN
INTRAMUSCULAR | Status: AC
Start: 2015-11-14 — End: 2015-11-14
  Filled 2015-11-14: qty 25

## 2015-11-14 MED ORDER — LIDOCAINE HCL (PF) 1 % IJ SOLN
INTRAMUSCULAR | Status: AC
Start: 2015-11-14 — End: 2015-11-14
  Filled 2015-11-14: qty 60

## 2015-11-14 MED ORDER — CHLORHEXIDINE GLUCONATE 4 % EX LIQD
60.0000 mL | Freq: Once | CUTANEOUS | Status: DC
  Filled 2015-11-14: qty 60

## 2015-11-14 MED ORDER — SODIUM CHLORIDE 0.9 % IJ SOLN: 3.0000 mL | Freq: Two times a day (BID) | INTRAMUSCULAR | Status: DC

## 2015-11-14 MED ORDER — CEFAZOLIN SODIUM-DEXTROSE 2-3 GM-% IV SOLR
2.0000 g | INTRAVENOUS | Status: AC
  Administered 2015-11-14: 2 g via INTRAVENOUS

## 2015-11-14 MED ORDER — SODIUM CHLORIDE 0.9 % IR SOLN
80.0000 mg | Status: AC
  Administered 2015-11-14: 80 mg
  Filled 2015-11-14: qty 2

## 2015-11-14 MED ORDER — LIDOCAINE HCL (PF) 1 % IJ SOLN
INTRAMUSCULAR | Status: DC | PRN
  Administered 2015-11-14: 31 mL

## 2015-11-14 MED ORDER — ONDANSETRON HCL 4 MG/2ML IJ SOLN
4.0000 mg | Freq: Four times a day (QID) | INTRAMUSCULAR | Status: DC | PRN
Start: 2015-11-14 — End: 2015-11-14

## 2015-11-14 MED ORDER — CEFAZOLIN SODIUM-DEXTROSE 2-3 GM-% IV SOLR
INTRAVENOUS | Status: AC
  Filled 2015-11-14: qty 50

## 2015-11-14 MED ORDER — SODIUM CHLORIDE 0.9 % IJ SOLN: 3.0000 mL | INTRAMUSCULAR | Status: DC | PRN

## 2015-11-14 MED ORDER — MUPIROCIN 2 % EX OINT
TOPICAL_OINTMENT | CUTANEOUS | Status: AC
  Filled 2015-11-14: qty 22

## 2015-11-14 MED ORDER — SODIUM CHLORIDE 0.9 % IV SOLN
INTRAVENOUS | Status: DC
  Administered 2015-11-14: 12:00:00 via INTRAVENOUS

## 2015-11-14 MED ORDER — SODIUM CHLORIDE 0.9 % IR SOLN
Status: AC
  Filled 2015-11-14: qty 2

## 2015-11-14 MED ORDER — SODIUM CHLORIDE 0.9 % IV SOLN: 250.0000 mL | INTRAVENOUS | Status: DC | PRN

## 2015-11-14 MED ORDER — MUPIROCIN 2 % EX OINT
1.0000 "application " | TOPICAL_OINTMENT | Freq: Once | CUTANEOUS | Status: AC
  Administered 2015-11-14: 1 via TOPICAL
  Filled 2015-11-14: qty 22

## 2015-11-14 MED ORDER — ACETAMINOPHEN 325 MG PO TABS
325.0000 mg | ORAL_TABLET | ORAL | Status: DC | PRN
  Filled 2015-11-14: qty 2

## 2015-11-14 SURGICAL SUPPLY — 5 items
CABLE SURGICAL S-101-97-12 (CABLE) ×1 IMPLANT
PACEMAKER ADAPTA DR ADDRL1 (Pacemaker) IMPLANT
PAD DEFIB LIFELINK (PAD) ×1 IMPLANT
PPM ADAPTA DR ADDRL1 (Pacemaker) ×2 IMPLANT
TRAY PACEMAKER INSERTION (PACKS) ×1 IMPLANT

## 2015-11-14 NOTE — H&P (View-Only) (Signed)
Electrophysiology Office Note   Date:  11/05/2015   ID:  [name], DOB July 11, 1933, MRN [id number]  PCP:  Samuel Jester, DO   Primary Electrophysiologist: Hillis Range, MD    CC: SOB   History of Present Illness: [name] is a 81 y.o. male who presents today for electrophysiology evaluation.   His primary concern is with macular degeneration.  He continues to slow down.  He has some dementia. HIs pacemaker has reached ERI.  THis is accompanied by some slight increase in SOB.  Today, he denies symptoms of palpitations, chest pain,  orthopnea, PND, lower extremity edema, claudication, dizziness, presyncope, syncope, bleeding, or neurologic sequela. The patient is tolerating medications without difficulties and is otherwise without complaint today.    Past Medical History  Diagnosis Date  . Malaise and fatigue   . Complete heart block (HCC)     s/p PPM  . HTN (hypertension)   . Diabetes mellitus   . HL (hearing loss)   . Aortic stenosis     mild   Past Surgical History  Procedure Laterality Date  . Hemorrhoid surgery    . Medtronic senia  12/04/07    PPM implantaed by [facility]     Current Outpatient Prescriptions  Medication Sig Dispense Refill  . amLODipine (NORVASC) 10 MG tablet Take 10 mg by mouth daily.      Marland Kitchen aspirin (ASPIR-LOW) 81 MG EC tablet Take 81 mg by mouth daily.      . Cholecalciferol (VITAMIN D3) 5000 UNITS CAPS Take by mouth daily.      . Cyanocobalamin (VITAMIN B-12 PO) Take by mouth daily.    Marland Kitchen doxazosin (CARDURA) 4 MG tablet Take 4 mg by mouth daily.      Marland Kitchen escitalopram (LEXAPRO) 20 MG tablet Take 20 mg by mouth daily.    . insulin glargine (LANTUS) 100 UNIT/ML injection Inject 100 Units into the skin as directed.      Marland Kitchen losartan (COZAAR) 100 MG tablet Take 100 mg by mouth daily.      . metFORMIN (GLUMETZA) 1000 MG (MOD) 24 hr tablet Take 1,000 mg by mouth 2 (two) times daily.      . Multiple Vitamins-Minerals (MULTIVITAL) tablet Take 1  tablet by mouth daily.      . Multiple Vitamins-Minerals (PRESERVISION AREDS) CAPS Take by mouth 2 (two) times daily.      . simvastatin (ZOCOR) 20 MG tablet Take 20 mg by mouth daily.    . travoprost, benzalkonium, (TRAVATAN Z) 0.004 % ophthalmic solution 1 drop as directed.      No current facility-administered medications for this visit.    Allergies:   Aspirin and Codeine   Social History:  The patient  reports that he quit smoking about 33 years ago. He has never used smokeless tobacco. He reports that he does not drink alcohol.   Family History:  The patient's family history includes Hypertension in his other.    ROS:  Please see the history of present illness.   All other systems are reviewed and negative.    PHYSICAL EXAM: VS:  BP 130/62 mmHg  Pulse 65  Ht 6' (1.829 m)  Wt 224 lb (101.606 kg)  BMI 30.37 kg/m2  SpO2 98% , BMI Body mass index is 30.37 kg/(m^2). GEN: Well nourished, well developed, in no acute distress HEENT: normal Neck: no JVD, carotid bruits, or masses Cardiac: RRR; 2/6 SEM LUSB (Early peaking) Respiratory:  clear to auscultation bilaterally, normal  work of breathing GI: soft, nontender, nondistended, + BS MS: no deformity or atrophy Skin: warm and dry, device pocket is well healed Neuro:  Strength and sensation are intact Psych: euthymic mood, full affect  Device interrogation is reviewed today in detail.  See PaceArt for details.   Recent Labs: No results found for requested labs within last 365 days.    Lipid Panel  No results found for: CHOL, TRIG, HDL, CHOLHDL, VLDL, LDLCALC, LDLDIRECT   Wt Readings from Last 3 Encounters:  11/03/15 224 lb (101.606 kg)  05/12/15 223 lb (101.152 kg)  05/02/14 227 lb (102.967 kg)     ASSESSMENT AND PLAN:  1.  Complete heart block He has reached ERI battery status Risks, benefits, and alternatives to pacemaker pulse generator replacement were discussed in detail today.  The patient understands that  risks include but are not limited to bleeding, infection, pneumothorax, perforation, tamponade, vascular damage, renal failure, MI, stroke, death,  damage to his existing leads, and lead dislodgement and wishes to proceed.  We will therefore schedule the procedure at the next available time.  2. Mild AS Stable No change required today Less echo was 1 year ago Repeat echo if symptoms worsen  3. HTN Stable No change required today  4. HL Stable No change required today   Current medicines are reviewed at length with the patient today.   The patient does not have concerns regarding his medicines.  The following changes were made today:  none  Labs/ tests ordered today include:  Orders Placed This Encounter  Procedures  . Basic metabolic panel  . CBC  . APTT  . Protime-INR      Signed, Hillis RangeJames Dajsha Massaro, MD  11/05/2015 8:10 AM     [facility] [address] [place] [address] [phone] (office) [phone] (fax)

## 2015-11-14 NOTE — Discharge Instructions (Signed)

## 2015-11-14 NOTE — Interval H&P Note (Signed)
History and Physical Interval Note:  11/14/2015 12:11 PM  [name]  has presented today for surgery, with the diagnosis of ERI  The various methods of treatment have been discussed with the patient and family. After consideration of risks, benefits and other options for treatment, the patient has consented to  Procedure(s): PPM Generator Changeout (N/A) as a surgical intervention .  The patient's history has been reviewed, patient examined, no change in status, stable for surgery.  I have reviewed the patient's chart and labs.  Questions were answered to the patient's satisfaction.     Hillis RangeJames Lorelie Biermann

## 2015-11-15 ENCOUNTER — Encounter (HOSPITAL_COMMUNITY): Payer: Self-pay | Admitting: Internal Medicine

## 2015-12-01 ENCOUNTER — Ambulatory Visit (INDEPENDENT_AMBULATORY_CARE_PROVIDER_SITE_OTHER): Payer: Medicare Other | Admitting: Internal Medicine

## 2015-12-01 ENCOUNTER — Encounter: Payer: Self-pay | Admitting: Internal Medicine

## 2015-12-01 VITALS — BP 138/72 | HR 65 | Ht 72.0 in | Wt 221.0 lb

## 2015-12-01 DIAGNOSIS — Z950 Presence of cardiac pacemaker: Secondary | ICD-10-CM | POA: Diagnosis not present

## 2015-12-01 DIAGNOSIS — I442 Atrioventricular block, complete: Secondary | ICD-10-CM

## 2015-12-01 NOTE — Patient Instructions (Addendum)
Your physician recommends that you continue on your current medications as directed. Please refer to the Current Medication list given to you today.  Your physician recommends that you schedule a follow-up appointment in: 3 months  

## 2015-12-02 ENCOUNTER — Encounter: Payer: Self-pay | Admitting: Internal Medicine

## 2015-12-02 NOTE — Progress Notes (Signed)
Wound check performed  Device pocket is healing nicely Interrogation is reviewed and normal  Return in 3 months for scheduled visit

## 2015-12-21 LAB — CUP PACEART INCLINIC DEVICE CHECK
Brady Statistic AP VP Percent: 39 %
Brady Statistic AP VS Percent: 0 %
Brady Statistic AS VP Percent: 61 %
Implantable Lead Implant Date: 20081205
Implantable Lead Implant Date: 20081205
Implantable Lead Location: 753859
Implantable Lead Model: 5076
Lead Channel Impedance Value: 412 Ohm
Lead Channel Impedance Value: 511 Ohm
Lead Channel Pacing Threshold Amplitude: 1 V
Lead Channel Sensing Intrinsic Amplitude: 4 mV
Lead Channel Setting Pacing Amplitude: 2.5 V
Lead Channel Setting Pacing Pulse Width: 0.4 ms
MDC IDC LEAD LOCATION: 753860
MDC IDC MSMT BATTERY IMPEDANCE: 100 Ohm
MDC IDC MSMT BATTERY REMAINING LONGEVITY: 133 mo
MDC IDC MSMT BATTERY VOLTAGE: 2.8 V
MDC IDC MSMT LEADCHNL RA PACING THRESHOLD AMPLITUDE: 0.75 V
MDC IDC MSMT LEADCHNL RA PACING THRESHOLD PULSEWIDTH: 0.4 ms
MDC IDC MSMT LEADCHNL RV PACING THRESHOLD PULSEWIDTH: 0.4 ms
MDC IDC SESS DTM: 20161202184247
MDC IDC SET LEADCHNL RA PACING AMPLITUDE: 2 V
MDC IDC SET LEADCHNL RV SENSING SENSITIVITY: 4 mV
MDC IDC STAT BRADY AS VS PERCENT: 0 %

## 2016-03-01 ENCOUNTER — Encounter: Payer: Medicare Other | Admitting: Internal Medicine

## 2016-06-05 ENCOUNTER — Encounter: Payer: Self-pay | Admitting: Internal Medicine

## 2016-06-05 ENCOUNTER — Ambulatory Visit (INDEPENDENT_AMBULATORY_CARE_PROVIDER_SITE_OTHER): Payer: Medicare Other | Admitting: Internal Medicine

## 2016-06-05 VITALS — BP 128/78 | HR 60 | Ht 72.0 in | Wt 224.0 lb

## 2016-06-05 DIAGNOSIS — I442 Atrioventricular block, complete: Secondary | ICD-10-CM | POA: Diagnosis not present

## 2016-06-05 DIAGNOSIS — Z950 Presence of cardiac pacemaker: Secondary | ICD-10-CM

## 2016-06-05 DIAGNOSIS — I350 Nonrheumatic aortic (valve) stenosis: Secondary | ICD-10-CM

## 2016-06-05 DIAGNOSIS — R0602 Shortness of breath: Secondary | ICD-10-CM

## 2016-06-05 DIAGNOSIS — I10 Essential (primary) hypertension: Secondary | ICD-10-CM | POA: Diagnosis not present

## 2016-06-05 LAB — CUP PACEART INCLINIC DEVICE CHECK
Battery Voltage: 2.78 V
Brady Statistic AP VS Percent: 0 %
Brady Statistic AS VP Percent: 74 %
Brady Statistic AS VS Percent: 0 %
Date Time Interrogation Session: 20170607155922
Implantable Lead Implant Date: 20081205
Implantable Lead Location: 753859
Implantable Lead Model: 5076
Lead Channel Impedance Value: 400 Ohm
Lead Channel Impedance Value: 537 Ohm
Lead Channel Pacing Threshold Amplitude: 1 V
Lead Channel Pacing Threshold Amplitude: 1.125 V
Lead Channel Pacing Threshold Pulse Width: 0.4 ms
Lead Channel Pacing Threshold Pulse Width: 0.4 ms
Lead Channel Pacing Threshold Pulse Width: 0.4 ms
Lead Channel Setting Pacing Amplitude: 2 V
Lead Channel Setting Pacing Amplitude: 2.5 V
Lead Channel Setting Pacing Pulse Width: 0.4 ms
Lead Channel Setting Sensing Sensitivity: 4 mV
MDC IDC LEAD IMPLANT DT: 20081205
MDC IDC LEAD LOCATION: 753860
MDC IDC MSMT BATTERY IMPEDANCE: 100 Ohm
MDC IDC MSMT BATTERY REMAINING LONGEVITY: 135 mo
MDC IDC MSMT LEADCHNL RA PACING THRESHOLD AMPLITUDE: 0.625 V
MDC IDC MSMT LEADCHNL RA PACING THRESHOLD AMPLITUDE: 0.75 V
MDC IDC MSMT LEADCHNL RA PACING THRESHOLD PULSEWIDTH: 0.4 ms
MDC IDC MSMT LEADCHNL RA SENSING INTR AMPL: 2.8 mV
MDC IDC STAT BRADY AP VP PERCENT: 26 %

## 2016-06-05 NOTE — Progress Notes (Signed)
Electrophysiology Office Note   Date:  06/05/2016   ID:  [name], DOB 03/05/1933, MRN [id number]  PCP:  Samuel Jester, DO   Primary Electrophysiologist: Hillis Range, MD    CC: SOB   History of Present Illness: [name] is a 82 y.o. male who presents today for electrophysiology evaluation.   He continues to slow down.  He has some dementia.  He has SOB and is not very active.  Today, he denies symptoms of palpitations, chest pain,  orthopnea, PND, lower extremity edema, claudication, dizziness, presyncope, syncope, bleeding, or neurologic sequela. The patient is tolerating medications without difficulties and is otherwise without complaint today.    Past Medical History  Diagnosis Date  . Malaise and fatigue   . Complete heart block (HCC)     s/p PPM  . HTN (hypertension)   . Diabetes mellitus   . HL (hearing loss)   . Aortic stenosis     mild   Past Surgical History  Procedure Laterality Date  . Hemorrhoid surgery    . Medtronic senia  12/04/07    PPM implantaed by [facility]  . Ep implantable device N/A 11/14/2015    Procedure: PPM Generator Changeout;  Surgeon: Hillis Range, MD; Medtronic Adapta L     Current Outpatient Prescriptions  Medication Sig Dispense Refill  . amLODipine (NORVASC) 10 MG tablet Take 10 mg by mouth daily.      Marland Kitchen aspirin (ASPIR-LOW) 81 MG EC tablet Take 81 mg by mouth daily.      . Cholecalciferol (VITAMIN D3) 5000 UNITS CAPS Take by mouth daily.      . Cyanocobalamin (VITAMIN B-12 PO) Take by mouth daily.    Marland Kitchen doxazosin (CARDURA) 4 MG tablet Take 4 mg by mouth daily.      Marland Kitchen escitalopram (LEXAPRO) 20 MG tablet Take 20 mg by mouth daily.    . insulin glargine (LANTUS) 100 UNIT/ML injection Inject 40 Units into the skin every evening.     Marland Kitchen losartan (COZAAR) 100 MG tablet Take 100 mg by mouth daily.      . metFORMIN (GLUMETZA) 1000 MG (MOD) 24 hr tablet Take 1,000 mg by mouth 2 (two) times daily.      . Multiple Vitamins-Minerals  (MULTIVITAL) tablet Take 1 tablet by mouth daily.      . Multiple Vitamins-Minerals (PRESERVISION AREDS) CAPS Take 1 capsule by mouth 2 (two) times daily.     . simvastatin (ZOCOR) 20 MG tablet Take 20 mg by mouth daily.    . travoprost, benzalkonium, (TRAVATAN Z) 0.004 % ophthalmic solution Place 1 drop into both eyes at bedtime.      No current facility-administered medications for this visit.    Allergies:   Aspirin and Codeine   Social History:  The patient  reports that he quit smoking about 34 years ago. He has never used smokeless tobacco. He reports that he does not drink alcohol.   Family History:  The patient's family history includes Hypertension in his other.    ROS:  Please see the history of present illness.   All other systems are reviewed and negative.    PHYSICAL EXAM: VS:  BP 128/78 mmHg  Pulse 60  Ht 6' (1.829 m)  Wt 224 lb (101.606 kg)  BMI 30.37 kg/m2  SpO2 98% , BMI Body mass index is 30.37 kg/(m^2). GEN: Well nourished, well developed, in no acute distress HEENT: normal Neck: no JVD, carotid bruits, or masses Cardiac: RRR;  2/6 SEM LUSB (mid peaking) Respiratory:  clear to auscultation bilaterally, normal work of breathing GI: soft, nontender, nondistended, + BS MS: no deformity or atrophy Skin: warm and dry, device pocket is well healed Neuro:  Strength and sensation are intact Psych: euthymic mood, full affect  Device interrogation is reviewed today in detail.  See PaceArt for details.   Wt Readings from Last 3 Encounters:  06/05/16 224 lb (101.606 kg)  12/01/15 221 lb (100.245 kg)  11/14/15 224 lb (101.606 kg)     ASSESSMENT AND PLAN:  1.  Complete heart block Normal pacemaker function See Pace Art report No changes today  2. Mild AS Given SOB and slight advancing of murmur on exam, will repeat echo at this time  3. HTN Stable No change required today  4. HL Stable No change required today   Current medicines are reviewed at  length with the patient today.   The patient does not have concerns regarding his medicines.  The following changes were made today:  none    Signed, Hillis RangeJames Kelsei Defino, MD  06/05/2016 2:00 PM     [facility] [address] [place] [address] [phone] (office) [phone] (fax)

## 2016-06-05 NOTE — Patient Instructions (Signed)
Your physician recommends that you continue on your current medications as directed. Please refer to the Current Medication list given to you today. Your physician has requested that you have an echocardiogram. Echocardiography is a painless test that uses sound waves to create images of your heart. It provides your doctor with information about the size and shape of your heart and how well your heart's chambers and valves are working. This procedure takes approximately one hour. There are no restrictions for this procedure. Device check on 09/04/16. Your physician recommends that you schedule a follow-up appointment in: 1 year with Dr. Johney FrameAllred. Please schedule this appointment today before leaving the office.

## 2016-06-12 ENCOUNTER — Other Ambulatory Visit: Payer: Self-pay

## 2016-06-12 ENCOUNTER — Ambulatory Visit (INDEPENDENT_AMBULATORY_CARE_PROVIDER_SITE_OTHER): Payer: Medicare Other

## 2016-06-12 DIAGNOSIS — R0602 Shortness of breath: Secondary | ICD-10-CM

## 2016-06-12 DIAGNOSIS — I350 Nonrheumatic aortic (valve) stenosis: Secondary | ICD-10-CM

## 2016-06-12 LAB — ECHOCARDIOGRAM COMPLETE
AO mean calculated velocity dopler: 224 cm/s
AOVTI: 67.2 cm
AV Area VTI index: 0.54 cm2/m2
AV Area VTI: 1.33 cm2
AV Area mean vel: 1.22 cm2
AV Mean grad: 23 mmHg
AV Peak grad: 42 mmHg
AV area mean vel ind: 0.53 cm2/m2
AV pk vel: 323 cm/s
AVCELMEANRAT: 0.43
Ao pk vel: 0.47 m/s
CHL CUP AV PEAK INDEX: 0.58
CHL CUP AV VALUE AREA INDEX: 0.54
CHL CUP AV VEL: 1.23
CHL CUP DOP CALC LVOT VTI: 29.2 cm
CHL CUP STROKE VOLUME: 48 mL
FS: 18 % — AB (ref 28–44)
IV/PV OW: 1.04
LA ID, A-P, ES: 39 mm
LADIAMINDEX: 1.7 cm/m2
LAVOL: 75.4 mL
LAVOLA4C: 58.4 mL
LAVOLIN: 32.8 mL/m2
LEFT ATRIUM END SYS DIAM: 39 mm
LV dias vol: 66 mL (ref 62–150)
LV sys vol index: 8 mL/m2
LV sys vol: 18 mL — AB (ref 21–61)
LVDIAVOLIN: 29 mL/m2
LVELAT: 6.19 cm/s
LVOT area: 2.84 cm2
LVOT diameter: 19 mm
LVOT peak VTI: 0.43 cm
LVOT peak grad rest: 9 mmHg
LVOTPV: 151 cm/s
LVOTSV: 83 mL
PW: 16.3 mm — AB (ref 0.6–1.1)
Simpson's disk: 72
TAPSE: 25.3 mm
TDI e' lateral: 6.19
TDI e' medial: 3.77
Valve area: 1.23 cm2

## 2016-06-17 ENCOUNTER — Telehealth: Payer: Self-pay | Admitting: *Deleted

## 2016-06-17 NOTE — Telephone Encounter (Signed)
Patient informed. 

## 2016-09-04 ENCOUNTER — Telehealth: Payer: Self-pay | Admitting: Cardiology

## 2016-09-04 ENCOUNTER — Ambulatory Visit (INDEPENDENT_AMBULATORY_CARE_PROVIDER_SITE_OTHER): Payer: Medicare Other | Admitting: *Deleted

## 2016-09-04 DIAGNOSIS — I442 Atrioventricular block, complete: Secondary | ICD-10-CM

## 2016-09-04 DIAGNOSIS — Z950 Presence of cardiac pacemaker: Secondary | ICD-10-CM

## 2016-09-04 NOTE — Telephone Encounter (Signed)
Spoke with pt and reminded pt of remote transmission that is due today. Pt verbalized understanding.   

## 2016-09-04 NOTE — Progress Notes (Signed)
Remote pacemaker transmission.   

## 2016-09-06 ENCOUNTER — Encounter: Payer: Self-pay | Admitting: Cardiology

## 2016-09-14 LAB — CUP PACEART REMOTE DEVICE CHECK
Battery Impedance: 100 Ohm
Battery Remaining Longevity: 135 mo
Brady Statistic AP VP Percent: 36 %
Brady Statistic AS VP Percent: 64 %
Brady Statistic AS VS Percent: 0 %
Implantable Lead Implant Date: 20081205
Implantable Lead Location: 753859
Implantable Lead Location: 753860
Implantable Lead Model: 5076
Lead Channel Impedance Value: 411 Ohm
Lead Channel Impedance Value: 562 Ohm
Lead Channel Pacing Threshold Amplitude: 0.625 V
Lead Channel Pacing Threshold Pulse Width: 0.4 ms
Lead Channel Sensing Intrinsic Amplitude: 2.8 mV
Lead Channel Setting Pacing Pulse Width: 0.4 ms
MDC IDC LEAD IMPLANT DT: 20081205
MDC IDC MSMT BATTERY VOLTAGE: 2.8 V
MDC IDC MSMT LEADCHNL RV PACING THRESHOLD AMPLITUDE: 1.125 V
MDC IDC MSMT LEADCHNL RV PACING THRESHOLD PULSEWIDTH: 0.4 ms
MDC IDC SESS DTM: 20170906162118
MDC IDC SET LEADCHNL RA PACING AMPLITUDE: 2 V
MDC IDC SET LEADCHNL RV PACING AMPLITUDE: 2.5 V
MDC IDC SET LEADCHNL RV SENSING SENSITIVITY: 4 mV
MDC IDC STAT BRADY AP VS PERCENT: 0 %

## 2016-12-04 ENCOUNTER — Ambulatory Visit (INDEPENDENT_AMBULATORY_CARE_PROVIDER_SITE_OTHER): Payer: Medicare Other | Admitting: *Deleted

## 2016-12-04 ENCOUNTER — Telehealth: Payer: Self-pay | Admitting: Cardiology

## 2016-12-04 DIAGNOSIS — I442 Atrioventricular block, complete: Secondary | ICD-10-CM | POA: Diagnosis not present

## 2016-12-04 NOTE — Telephone Encounter (Signed)
LMOVM reminding pt to send remote transmission.   

## 2016-12-05 NOTE — Progress Notes (Signed)
Remote pacemaker transmission.   

## 2016-12-11 ENCOUNTER — Encounter: Payer: Self-pay | Admitting: Cardiology

## 2016-12-25 ENCOUNTER — Ambulatory Visit (INDEPENDENT_AMBULATORY_CARE_PROVIDER_SITE_OTHER): Payer: Medicare Other | Admitting: Urology

## 2016-12-25 DIAGNOSIS — R972 Elevated prostate specific antigen [PSA]: Secondary | ICD-10-CM | POA: Diagnosis not present

## 2016-12-25 DIAGNOSIS — N401 Enlarged prostate with lower urinary tract symptoms: Secondary | ICD-10-CM | POA: Diagnosis not present

## 2016-12-25 DIAGNOSIS — R351 Nocturia: Secondary | ICD-10-CM

## 2016-12-31 LAB — CUP PACEART REMOTE DEVICE CHECK
Battery Impedance: 111 Ohm
Battery Remaining Longevity: 130 mo
Battery Voltage: 2.79 V
Brady Statistic AP VP Percent: 33 %
Brady Statistic AP VS Percent: 0 %
Brady Statistic AS VP Percent: 67 %
Implantable Lead Location: 753860
Implantable Lead Model: 5076
Implantable Lead Model: 5076
Implantable Pulse Generator Implant Date: 20161115
Lead Channel Impedance Value: 418 Ohm
Lead Channel Pacing Threshold Amplitude: 0.625 V
Lead Channel Pacing Threshold Pulse Width: 0.4 ms
Lead Channel Pacing Threshold Pulse Width: 0.4 ms
Lead Channel Setting Pacing Amplitude: 2 V
Lead Channel Setting Pacing Amplitude: 2.5 V
Lead Channel Setting Pacing Pulse Width: 0.4 ms
MDC IDC LEAD IMPLANT DT: 20081205
MDC IDC LEAD IMPLANT DT: 20081205
MDC IDC LEAD LOCATION: 753859
MDC IDC MSMT LEADCHNL RV IMPEDANCE VALUE: 493 Ohm
MDC IDC MSMT LEADCHNL RV PACING THRESHOLD AMPLITUDE: 1.125 V
MDC IDC SESS DTM: 20171206215811
MDC IDC SET LEADCHNL RV SENSING SENSITIVITY: 4 mV
MDC IDC STAT BRADY AS VS PERCENT: 0 %

## 2017-03-05 ENCOUNTER — Ambulatory Visit (INDEPENDENT_AMBULATORY_CARE_PROVIDER_SITE_OTHER): Payer: Medicare Other | Admitting: *Deleted

## 2017-03-05 DIAGNOSIS — I442 Atrioventricular block, complete: Secondary | ICD-10-CM | POA: Diagnosis not present

## 2017-03-05 NOTE — Progress Notes (Signed)
Remote pacemaker transmission.   

## 2017-03-06 ENCOUNTER — Encounter: Payer: Self-pay | Admitting: Cardiology

## 2017-03-07 LAB — CUP PACEART REMOTE DEVICE CHECK
Battery Impedance: 135 Ohm
Battery Remaining Longevity: 124 mo
Battery Voltage: 2.79 V
Implantable Lead Implant Date: 20081205
Implantable Lead Location: 753859
Implantable Lead Location: 753860
Implantable Lead Model: 5076
Implantable Lead Model: 5076
Implantable Pulse Generator Implant Date: 20161115
Lead Channel Pacing Threshold Pulse Width: 0.4 ms
Lead Channel Pacing Threshold Pulse Width: 0.4 ms
Lead Channel Setting Pacing Amplitude: 2 V
Lead Channel Setting Pacing Amplitude: 2.5 V
Lead Channel Setting Pacing Pulse Width: 0.4 ms
Lead Channel Setting Sensing Sensitivity: 4 mV
MDC IDC LEAD IMPLANT DT: 20081205
MDC IDC MSMT LEADCHNL RA IMPEDANCE VALUE: 411 Ohm
MDC IDC MSMT LEADCHNL RA PACING THRESHOLD AMPLITUDE: 0.625 V
MDC IDC MSMT LEADCHNL RV IMPEDANCE VALUE: 503 Ohm
MDC IDC MSMT LEADCHNL RV PACING THRESHOLD AMPLITUDE: 0.875 V
MDC IDC SESS DTM: 20180307154715
MDC IDC STAT BRADY AP VP PERCENT: 32 %
MDC IDC STAT BRADY AP VS PERCENT: 0 %
MDC IDC STAT BRADY AS VP PERCENT: 68 %
MDC IDC STAT BRADY AS VS PERCENT: 0 %

## 2017-05-30 ENCOUNTER — Encounter: Payer: Medicare Other | Admitting: Internal Medicine

## 2017-06-19 ENCOUNTER — Encounter: Payer: Self-pay | Admitting: *Deleted

## 2017-06-20 ENCOUNTER — Ambulatory Visit (INDEPENDENT_AMBULATORY_CARE_PROVIDER_SITE_OTHER): Payer: Medicare Other | Admitting: Internal Medicine

## 2017-06-20 ENCOUNTER — Encounter: Payer: Self-pay | Admitting: Internal Medicine

## 2017-06-20 VITALS — BP 116/70 | HR 64 | Ht 71.0 in | Wt 222.0 lb

## 2017-06-20 DIAGNOSIS — I10 Essential (primary) hypertension: Secondary | ICD-10-CM

## 2017-06-20 DIAGNOSIS — I442 Atrioventricular block, complete: Secondary | ICD-10-CM

## 2017-06-20 DIAGNOSIS — I060 Rheumatic aortic stenosis: Secondary | ICD-10-CM

## 2017-06-20 NOTE — Progress Notes (Signed)
PCP: Samuel Jester, DO  [name] is a 83 y.o. male who presents today for routine electrophysiology followup.  Since last being seen in our clinic, the patient reports doing very well.  Today, he denies symptoms of palpitations, chest pain, shortness of breath,  lower extremity edema, dizziness, presyncope, or syncope.  The patient is otherwise without complaint today.   Past Medical History:  Diagnosis Date  . Aortic stenosis    mild  . Complete heart block (HCC)    s/p PPM  . Diabetes mellitus   . HL (hearing loss)   . HTN (hypertension)   . Malaise and fatigue    Past Surgical History:  Procedure Laterality Date  . EP IMPLANTABLE DEVICE N/A 11/14/2015   Procedure: PPM Generator Changeout;  Surgeon: Hillis Range, MD; Medtronic Adapta L  . HEMORRHOID SURGERY    . medtronic senia  12/04/07   PPM implantaed by [facility]    ROS- all systems are reviewed and negative except as per HPI above  Current Outpatient Prescriptions  Medication Sig Dispense Refill  . amLODipine (NORVASC) 10 MG tablet Take 10 mg by mouth daily.      Marland Kitchen aspirin (ASPIR-LOW) 81 MG EC tablet Take 81 mg by mouth daily.      . Cholecalciferol (VITAMIN D3) 5000 UNITS CAPS Take by mouth daily.      . Cyanocobalamin (VITAMIN B-12 PO) Take by mouth daily.    Marland Kitchen doxazosin (CARDURA) 4 MG tablet Take 4 mg by mouth daily.      . insulin glargine (LANTUS) 100 UNIT/ML injection Inject 40 Units into the skin every evening.     Marland Kitchen losartan (COZAAR) 100 MG tablet Take 100 mg by mouth daily.      . metFORMIN (GLUMETZA) 1000 MG (MOD) 24 hr tablet Take 1,000 mg by mouth 2 (two) times daily.      . Multiple Vitamins-Minerals (MULTIVITAL) tablet Take 1 tablet by mouth daily.      . Multiple Vitamins-Minerals (PRESERVISION AREDS) CAPS Take 1 capsule by mouth 2 (two) times daily.     . pantoprazole (PROTONIX) 40 MG tablet Take 40 mg by mouth daily.    . simvastatin (ZOCOR) 20 MG tablet Take 20 mg by mouth daily.    . tamsulosin  (FLOMAX) 0.4 MG CAPS capsule Take 0.4 mg by mouth.    . travoprost, benzalkonium, (TRAVATAN Z) 0.004 % ophthalmic solution Place 1 drop into both eyes at bedtime.      No current facility-administered medications for this visit.     Physical Exam: Vitals:   06/20/17 1148  BP: 116/70  Pulse: 64  SpO2: 94%  Weight: 222 lb (100.7 kg)  Height: 5\' 11"  (1.803 m)    GEN- The patient is well appearing, alert and oriented x 3 today.   Head- normocephalic, atraumatic Eyes-  Sclera clear, conjunctiva pink Ears- hearing intact Oropharynx- clear Lungs- Clear to ausculation bilaterally, normal work of breathing Chest- pacemaker pocket is well healed Heart- Regular rate and rhythm, 3/6 SEM LUSB which is late peaking, S 2 is still audible GI- soft, NT, ND, + BS Extremities- no clubbing, cyanosis, or edema  Pacemaker interrogation- reviewed in detail today,  See PACEART report  Assessment and Plan:  1. Complete heart block Normal pacemaker function See Pace Art report No changes today  2. Moderate AS Echo 2017 is reviewed No worsening of symptoms on exam Will follow clinically  3.  HTN Stable No change required today  carelink Return to  see me in 1 year  Hillis RangeJames Jden Want MD, [facility] 06/20/2017 12:26 PM

## 2017-06-20 NOTE — Patient Instructions (Signed)
Medication Instructions:   Your physician recommends that you continue on your current medications as directed. Please refer to the Current Medication list given to you today.  Labwork:  NONE  Testing/Procedures:  NONE  Follow-Up:  Your physician recommends that you schedule a follow-up appointment in: 1 year with Dr. Johney FrameAllred.  Any Other Special Instructions Will Be Listed Below (If Applicable).  Your next device check from home is on 09/22/17.   If you need a refill on your cardiac medications before your next appointment, please call your pharmacy.

## 2017-06-23 LAB — CUP PACEART INCLINIC DEVICE CHECK
Battery Remaining Longevity: 125 mo
Battery Voltage: 2.79 V
Brady Statistic AP VP Percent: 33 %
Brady Statistic AS VP Percent: 67 %
Brady Statistic AS VS Percent: 0 %
Date Time Interrogation Session: 20180622162248
Implantable Lead Implant Date: 20081205
Implantable Lead Location: 753859
Implantable Lead Location: 753860
Implantable Pulse Generator Implant Date: 20161115
Lead Channel Impedance Value: 560 Ohm
Lead Channel Pacing Threshold Amplitude: 0.75 V
Lead Channel Pacing Threshold Pulse Width: 0.4 ms
Lead Channel Pacing Threshold Pulse Width: 0.4 ms
Lead Channel Pacing Threshold Pulse Width: 0.4 ms
Lead Channel Setting Pacing Amplitude: 2 V
Lead Channel Setting Pacing Amplitude: 2.5 V
Lead Channel Setting Pacing Pulse Width: 0.4 ms
Lead Channel Setting Sensing Sensitivity: 4 mV
MDC IDC LEAD IMPLANT DT: 20081205
MDC IDC MSMT BATTERY IMPEDANCE: 135 Ohm
MDC IDC MSMT LEADCHNL RA IMPEDANCE VALUE: 417 Ohm
MDC IDC MSMT LEADCHNL RA PACING THRESHOLD AMPLITUDE: 0.75 V
MDC IDC MSMT LEADCHNL RA SENSING INTR AMPL: 2.8 mV
MDC IDC MSMT LEADCHNL RV PACING THRESHOLD AMPLITUDE: 0.875 V
MDC IDC MSMT LEADCHNL RV PACING THRESHOLD AMPLITUDE: 1 V
MDC IDC MSMT LEADCHNL RV PACING THRESHOLD PULSEWIDTH: 0.4 ms
MDC IDC STAT BRADY AP VS PERCENT: 0 %

## 2017-06-25 ENCOUNTER — Ambulatory Visit (INDEPENDENT_AMBULATORY_CARE_PROVIDER_SITE_OTHER): Payer: Medicare Other | Admitting: Urology

## 2017-06-25 DIAGNOSIS — R972 Elevated prostate specific antigen [PSA]: Secondary | ICD-10-CM | POA: Diagnosis not present

## 2017-06-25 DIAGNOSIS — R351 Nocturia: Secondary | ICD-10-CM

## 2017-06-25 DIAGNOSIS — N401 Enlarged prostate with lower urinary tract symptoms: Secondary | ICD-10-CM | POA: Diagnosis not present

## 2017-09-22 ENCOUNTER — Ambulatory Visit (INDEPENDENT_AMBULATORY_CARE_PROVIDER_SITE_OTHER): Payer: Medicare Other | Admitting: *Deleted

## 2017-09-22 DIAGNOSIS — I442 Atrioventricular block, complete: Secondary | ICD-10-CM | POA: Diagnosis not present

## 2017-09-22 NOTE — Progress Notes (Signed)
Remote pacemaker transmission.   

## 2017-09-23 LAB — CUP PACEART REMOTE DEVICE CHECK
Battery Voltage: 2.79 V
Brady Statistic AP VS Percent: 0 %
Brady Statistic AS VP Percent: 62 %
Brady Statistic AS VS Percent: 0 %
Implantable Lead Implant Date: 20081205
Implantable Lead Location: 753860
Implantable Lead Model: 5076
Lead Channel Impedance Value: 512 Ohm
Lead Channel Pacing Threshold Amplitude: 0.75 V
Lead Channel Pacing Threshold Amplitude: 1 V
Lead Channel Pacing Threshold Pulse Width: 0.4 ms
Lead Channel Pacing Threshold Pulse Width: 0.4 ms
MDC IDC LEAD IMPLANT DT: 20081205
MDC IDC LEAD LOCATION: 753859
MDC IDC MSMT BATTERY IMPEDANCE: 159 Ohm
MDC IDC MSMT BATTERY REMAINING LONGEVITY: 118 mo
MDC IDC MSMT LEADCHNL RA IMPEDANCE VALUE: 422 Ohm
MDC IDC PG IMPLANT DT: 20161115
MDC IDC SESS DTM: 20180924142205
MDC IDC SET LEADCHNL RA PACING AMPLITUDE: 2 V
MDC IDC SET LEADCHNL RV PACING AMPLITUDE: 2.5 V
MDC IDC SET LEADCHNL RV PACING PULSEWIDTH: 0.4 ms
MDC IDC SET LEADCHNL RV SENSING SENSITIVITY: 4 mV
MDC IDC STAT BRADY AP VP PERCENT: 38 %

## 2017-09-25 ENCOUNTER — Encounter: Payer: Self-pay | Admitting: Cardiology

## 2017-12-24 ENCOUNTER — Ambulatory Visit: Payer: Medicare Other | Admitting: Urology

## 2017-12-24 ENCOUNTER — Ambulatory Visit (INDEPENDENT_AMBULATORY_CARE_PROVIDER_SITE_OTHER): Payer: Medicare Other | Admitting: *Deleted

## 2017-12-24 DIAGNOSIS — R351 Nocturia: Secondary | ICD-10-CM | POA: Diagnosis not present

## 2017-12-24 DIAGNOSIS — R972 Elevated prostate specific antigen [PSA]: Secondary | ICD-10-CM | POA: Diagnosis not present

## 2017-12-24 DIAGNOSIS — I442 Atrioventricular block, complete: Secondary | ICD-10-CM

## 2017-12-24 DIAGNOSIS — N401 Enlarged prostate with lower urinary tract symptoms: Secondary | ICD-10-CM

## 2017-12-24 NOTE — Progress Notes (Signed)
Remote pacemaker transmission.   

## 2017-12-25 ENCOUNTER — Encounter: Payer: Self-pay | Admitting: Cardiology

## 2017-12-25 LAB — CUP PACEART REMOTE DEVICE CHECK
Brady Statistic AP VP Percent: 35 %
Brady Statistic AP VS Percent: 0 %
Brady Statistic AS VS Percent: 0 %
Date Time Interrogation Session: 20181226150829
Implantable Lead Implant Date: 20081205
Implantable Lead Location: 753860
Lead Channel Impedance Value: 504 Ohm
Lead Channel Pacing Threshold Amplitude: 1 V
Lead Channel Pacing Threshold Pulse Width: 0.4 ms
Lead Channel Sensing Intrinsic Amplitude: 2.8 mV
Lead Channel Setting Pacing Amplitude: 2 V
Lead Channel Setting Sensing Sensitivity: 4 mV
MDC IDC LEAD IMPLANT DT: 20081205
MDC IDC LEAD LOCATION: 753859
MDC IDC MSMT BATTERY IMPEDANCE: 135 Ohm
MDC IDC MSMT BATTERY REMAINING LONGEVITY: 123 mo
MDC IDC MSMT BATTERY VOLTAGE: 2.79 V
MDC IDC MSMT LEADCHNL RA IMPEDANCE VALUE: 422 Ohm
MDC IDC MSMT LEADCHNL RA PACING THRESHOLD AMPLITUDE: 0.875 V
MDC IDC MSMT LEADCHNL RA PACING THRESHOLD PULSEWIDTH: 0.4 ms
MDC IDC PG IMPLANT DT: 20161115
MDC IDC SET LEADCHNL RV PACING AMPLITUDE: 2.5 V
MDC IDC SET LEADCHNL RV PACING PULSEWIDTH: 0.4 ms
MDC IDC STAT BRADY AS VP PERCENT: 65 %

## 2018-03-25 ENCOUNTER — Ambulatory Visit (INDEPENDENT_AMBULATORY_CARE_PROVIDER_SITE_OTHER): Payer: Medicare Other | Admitting: *Deleted

## 2018-03-25 DIAGNOSIS — I442 Atrioventricular block, complete: Secondary | ICD-10-CM | POA: Diagnosis not present

## 2018-03-25 NOTE — Progress Notes (Signed)
Remote pacemaker transmission.   

## 2018-03-27 ENCOUNTER — Encounter: Payer: Self-pay | Admitting: Cardiology

## 2018-03-27 LAB — CUP PACEART REMOTE DEVICE CHECK
Battery Impedance: 135 Ohm
Battery Remaining Longevity: 123 mo
Battery Voltage: 2.79 V
Brady Statistic AP VP Percent: 35 %
Brady Statistic AS VP Percent: 65 %
Implantable Lead Implant Date: 20081205
Implantable Lead Model: 5076
Implantable Lead Model: 5076
Implantable Pulse Generator Implant Date: 20161115
Lead Channel Impedance Value: 417 Ohm
Lead Channel Pacing Threshold Amplitude: 0.875 V
Lead Channel Pacing Threshold Amplitude: 1 V
Lead Channel Setting Pacing Amplitude: 2 V
Lead Channel Setting Pacing Amplitude: 2.5 V
Lead Channel Setting Pacing Pulse Width: 0.4 ms
MDC IDC LEAD IMPLANT DT: 20081205
MDC IDC LEAD LOCATION: 753859
MDC IDC LEAD LOCATION: 753860
MDC IDC MSMT LEADCHNL RA PACING THRESHOLD PULSEWIDTH: 0.4 ms
MDC IDC MSMT LEADCHNL RV IMPEDANCE VALUE: 513 Ohm
MDC IDC MSMT LEADCHNL RV PACING THRESHOLD PULSEWIDTH: 0.4 ms
MDC IDC SESS DTM: 20190327133225
MDC IDC SET LEADCHNL RV SENSING SENSITIVITY: 4 mV
MDC IDC STAT BRADY AP VS PERCENT: 0 %
MDC IDC STAT BRADY AS VS PERCENT: 0 %

## 2018-06-05 ENCOUNTER — Encounter: Payer: Medicare Other | Admitting: Internal Medicine

## 2018-06-12 ENCOUNTER — Encounter: Payer: Self-pay | Admitting: Internal Medicine

## 2018-06-12 ENCOUNTER — Ambulatory Visit: Payer: Medicare Other | Admitting: Internal Medicine

## 2018-06-12 VITALS — BP 130/78 | HR 76 | Ht 71.0 in | Wt 216.4 lb

## 2018-06-12 DIAGNOSIS — Z950 Presence of cardiac pacemaker: Secondary | ICD-10-CM

## 2018-06-12 DIAGNOSIS — I442 Atrioventricular block, complete: Secondary | ICD-10-CM

## 2018-06-12 LAB — CUP PACEART INCLINIC DEVICE CHECK
Battery Impedance: 159 Ohm
Battery Remaining Longevity: 120 mo
Battery Voltage: 2.79 V
Brady Statistic AP VP Percent: 34 %
Brady Statistic AP VS Percent: 0 %
Brady Statistic AS VS Percent: 0 %
Date Time Interrogation Session: 20190614114141
Implantable Lead Implant Date: 20081205
Implantable Lead Model: 5076
Implantable Lead Model: 5076
Lead Channel Impedance Value: 440 Ohm
Lead Channel Impedance Value: 547 Ohm
Lead Channel Pacing Threshold Pulse Width: 0.4 ms
Lead Channel Sensing Intrinsic Amplitude: 2.8 mV
Lead Channel Setting Pacing Pulse Width: 0.4 ms
MDC IDC LEAD IMPLANT DT: 20081205
MDC IDC LEAD LOCATION: 753859
MDC IDC LEAD LOCATION: 753860
MDC IDC MSMT LEADCHNL RA PACING THRESHOLD AMPLITUDE: 0.75 V
MDC IDC MSMT LEADCHNL RV PACING THRESHOLD AMPLITUDE: 1 V
MDC IDC MSMT LEADCHNL RV PACING THRESHOLD PULSEWIDTH: 0.4 ms
MDC IDC PG IMPLANT DT: 20161115
MDC IDC SET LEADCHNL RA PACING AMPLITUDE: 2 V
MDC IDC SET LEADCHNL RV PACING AMPLITUDE: 2.5 V
MDC IDC SET LEADCHNL RV SENSING SENSITIVITY: 4 mV
MDC IDC STAT BRADY AS VP PERCENT: 66 %

## 2018-06-12 NOTE — Progress Notes (Signed)
PCP: Samuel JesterButler, [name], DO   Primary EP:  Dr Johney FrameAllred  [name] [name] is a 84 y.o. male who presents today for routine electrophysiology followup.  Since last being seen in our clinic, the patient reports doing very well.  He has severe macular degeneration which has become very debilitating for him.  Today, he denies symptoms of palpitations, chest pain, shortness of breath,  lower extremity edema, dizziness, presyncope, or syncope.  The patient is otherwise without complaint today.   Past Medical History:  Diagnosis Date  . Aortic stenosis    mild  . Complete heart block (HCC)    s/p PPM  . Diabetes mellitus   . HL (hearing loss)   . HTN (hypertension)   . Malaise and fatigue    Past Surgical History:  Procedure Laterality Date  . EP IMPLANTABLE DEVICE N/A 11/14/2015   Procedure: PPM Generator Changeout;  Surgeon: Hillis RangeJames Gretchen Weinfeld, MD; Medtronic Adapta L  . HEMORRHOID SURGERY    . medtronic senia  12/04/07   PPM implantaed by [facility]    ROS- all systems are reviewed and negative except as per HPI above  Current Outpatient Medications  Medication Sig Dispense Refill  . amLODipine (NORVASC) 10 MG tablet Take 10 mg by mouth daily.      Marland Kitchen. aspirin (ASPIR-LOW) 81 MG EC tablet Take 81 mg by mouth daily.      . Cholecalciferol (VITAMIN D3) 5000 UNITS CAPS Take by mouth daily.      . Cyanocobalamin (VITAMIN B-12 PO) Take by mouth daily.    Marland Kitchen. doxazosin (CARDURA) 4 MG tablet Take 4 mg by mouth daily.      . insulin glargine (LANTUS) 100 UNIT/ML injection Inject 40 Units into the skin every evening.     Marland Kitchen. levothyroxine (SYNTHROID, LEVOTHROID) 50 MCG tablet Take 50 mcg by mouth daily before breakfast.    . losartan (COZAAR) 100 MG tablet Take 100 mg by mouth daily.      . metFORMIN (GLUMETZA) 1000 MG (MOD) 24 hr tablet Take 1,000 mg by mouth 2 (two) times daily.      . Multiple Vitamins-Minerals (MULTIVITAL) tablet Take 1 tablet by mouth daily.      . Multiple Vitamins-Minerals (PRESERVISION  AREDS) CAPS Take 1 capsule by mouth 2 (two) times daily.     . pantoprazole (PROTONIX) 40 MG tablet Take 40 mg by mouth daily.    . simvastatin (ZOCOR) 20 MG tablet Take 20 mg by mouth daily.    . tamsulosin (FLOMAX) 0.4 MG CAPS capsule Take 0.4 mg by mouth.    . travoprost, benzalkonium, (TRAVATAN Z) 0.004 % ophthalmic solution Place 1 drop into both eyes at bedtime.      No current facility-administered medications for this visit.     Physical Exam: Vitals:   06/12/18 1030  BP: 130/78  Pulse: 76  Weight: 216 lb 6.4 oz (98.2 kg)  Height: 5\' 11"  (1.803 m)    GEN- The patient is well appearing, alert and oriented x 3 today.   Head- normocephalic, atraumatic Eyes-  Poor vision Ears- hearing intact Oropharynx- clear Lungs- Clear to ausculation bilaterally, normal work of breathing Chest- pacemaker pocket is well healed Heart- Regular rate and rhythm, 3/6 SEM LUSB mid peaking GI- soft, NT, ND, + BS Extremities- no clubbing, cyanosis, or edema  Pacemaker interrogation- reviewed in detail today,  See PACEART report   Assessment and Plan:  1. Symptomatic complete heart block Normal pacemaker function See Pace Art report No  changes today  2. HTN Stable No change required today  3. Moderate AS Echo 2017 reviewed No worsening of symptoms.  Will follow clinically  Carelink Return to see me in a year  Hillis Range MD, [facility] 06/12/2018 11:10 AM

## 2018-06-12 NOTE — Patient Instructions (Addendum)

## 2018-06-24 ENCOUNTER — Telehealth: Payer: Self-pay

## 2018-06-24 ENCOUNTER — Ambulatory Visit (INDEPENDENT_AMBULATORY_CARE_PROVIDER_SITE_OTHER): Payer: Medicare Other | Admitting: *Deleted

## 2018-06-24 DIAGNOSIS — I442 Atrioventricular block, complete: Secondary | ICD-10-CM

## 2018-06-24 NOTE — Progress Notes (Signed)
Remote pacemaker transmission.   

## 2018-06-24 NOTE — Telephone Encounter (Signed)
Attempted to confirm remote transmission with pt. No answer and was unable to leave a message.   

## 2018-06-25 ENCOUNTER — Encounter: Payer: Self-pay | Admitting: Cardiology

## 2018-06-25 LAB — CUP PACEART REMOTE DEVICE CHECK
Battery Impedance: 183 Ohm
Battery Remaining Longevity: 113 mo
Battery Voltage: 2.79 V
Brady Statistic AP VS Percent: 0 %
Brady Statistic AS VP Percent: 71 %
Brady Statistic AS VS Percent: 0 %
Implantable Lead Implant Date: 20081205
Implantable Lead Implant Date: 20081205
Implantable Lead Location: 753859
Implantable Lead Model: 5076
Lead Channel Impedance Value: 501 Ohm
Lead Channel Pacing Threshold Amplitude: 1 V
Lead Channel Pacing Threshold Pulse Width: 0.4 ms
Lead Channel Pacing Threshold Pulse Width: 0.4 ms
Lead Channel Setting Pacing Amplitude: 2.5 V
Lead Channel Setting Pacing Pulse Width: 0.4 ms
Lead Channel Setting Sensing Sensitivity: 4 mV
MDC IDC LEAD LOCATION: 753860
MDC IDC MSMT LEADCHNL RA IMPEDANCE VALUE: 417 Ohm
MDC IDC MSMT LEADCHNL RV PACING THRESHOLD AMPLITUDE: 1.125 V
MDC IDC PG IMPLANT DT: 20161115
MDC IDC SESS DTM: 20190626144635
MDC IDC SET LEADCHNL RA PACING AMPLITUDE: 2 V
MDC IDC STAT BRADY AP VP PERCENT: 29 %

## 2018-09-23 ENCOUNTER — Telehealth: Payer: Self-pay

## 2018-09-23 ENCOUNTER — Ambulatory Visit (INDEPENDENT_AMBULATORY_CARE_PROVIDER_SITE_OTHER): Payer: Medicare Other | Admitting: *Deleted

## 2018-09-23 DIAGNOSIS — I442 Atrioventricular block, complete: Secondary | ICD-10-CM

## 2018-09-23 NOTE — Telephone Encounter (Signed)
Spoke with pt and reminded pt of remote transmission that is due today. Pt verbalized understanding.   

## 2018-09-24 ENCOUNTER — Encounter: Payer: Self-pay | Admitting: Cardiology

## 2018-09-24 NOTE — Progress Notes (Signed)
Remote pacemaker transmission.   

## 2018-10-13 LAB — CUP PACEART REMOTE DEVICE CHECK
Battery Remaining Longevity: 118 mo
Brady Statistic AP VP Percent: 34 %
Brady Statistic AS VS Percent: 0 %
Date Time Interrogation Session: 20190925145550
Implantable Lead Implant Date: 20081205
Implantable Pulse Generator Implant Date: 20161115
Lead Channel Impedance Value: 428 Ohm
Lead Channel Pacing Threshold Amplitude: 1.125 V
Lead Channel Setting Pacing Amplitude: 2 V
Lead Channel Setting Pacing Amplitude: 2.5 V
Lead Channel Setting Sensing Sensitivity: 4 mV
MDC IDC LEAD IMPLANT DT: 20081205
MDC IDC LEAD LOCATION: 753859
MDC IDC LEAD LOCATION: 753860
MDC IDC MSMT BATTERY IMPEDANCE: 159 Ohm
MDC IDC MSMT BATTERY VOLTAGE: 2.79 V
MDC IDC MSMT LEADCHNL RA PACING THRESHOLD AMPLITUDE: 0.75 V
MDC IDC MSMT LEADCHNL RA PACING THRESHOLD PULSEWIDTH: 0.4 ms
MDC IDC MSMT LEADCHNL RV IMPEDANCE VALUE: 501 Ohm
MDC IDC MSMT LEADCHNL RV PACING THRESHOLD PULSEWIDTH: 0.4 ms
MDC IDC SET LEADCHNL RV PACING PULSEWIDTH: 0.4 ms
MDC IDC STAT BRADY AP VS PERCENT: 0 %
MDC IDC STAT BRADY AS VP PERCENT: 66 %

## 2018-12-16 ENCOUNTER — Ambulatory Visit: Payer: Medicare Other | Admitting: Urology

## 2018-12-24 ENCOUNTER — Ambulatory Visit (INDEPENDENT_AMBULATORY_CARE_PROVIDER_SITE_OTHER): Payer: Medicare Other

## 2018-12-24 DIAGNOSIS — I442 Atrioventricular block, complete: Secondary | ICD-10-CM

## 2018-12-24 LAB — CUP PACEART REMOTE DEVICE CHECK
Battery Impedance: 183 Ohm
Battery Voltage: 2.79 V
Brady Statistic AP VP Percent: 37 %
Implantable Lead Implant Date: 20081205
Implantable Lead Implant Date: 20081205
Implantable Lead Location: 753859
Implantable Lead Model: 5076
Implantable Lead Model: 5076
Implantable Pulse Generator Implant Date: 20161115
Lead Channel Impedance Value: 406 Ohm
Lead Channel Pacing Threshold Pulse Width: 0.4 ms
Lead Channel Sensing Intrinsic Amplitude: 1.4 mV
Lead Channel Setting Pacing Amplitude: 2 V
Lead Channel Setting Pacing Amplitude: 2.5 V
Lead Channel Setting Sensing Sensitivity: 4 mV
MDC IDC LEAD LOCATION: 753860
MDC IDC MSMT BATTERY REMAINING LONGEVITY: 112 mo
MDC IDC MSMT LEADCHNL RA PACING THRESHOLD AMPLITUDE: 1 V
MDC IDC MSMT LEADCHNL RA PACING THRESHOLD PULSEWIDTH: 0.4 ms
MDC IDC MSMT LEADCHNL RV IMPEDANCE VALUE: 478 Ohm
MDC IDC MSMT LEADCHNL RV PACING THRESHOLD AMPLITUDE: 1.125 V
MDC IDC SESS DTM: 20191226143244
MDC IDC SET LEADCHNL RV PACING PULSEWIDTH: 0.4 ms
MDC IDC STAT BRADY AP VS PERCENT: 0 %
MDC IDC STAT BRADY AS VP PERCENT: 63 %
MDC IDC STAT BRADY AS VS PERCENT: 0 %

## 2018-12-24 NOTE — Progress Notes (Signed)
Remote pacemaker transmission.   

## 2019-03-25 ENCOUNTER — Other Ambulatory Visit: Payer: Self-pay

## 2019-03-25 ENCOUNTER — Ambulatory Visit (INDEPENDENT_AMBULATORY_CARE_PROVIDER_SITE_OTHER): Payer: Medicare Other | Admitting: *Deleted

## 2019-03-25 DIAGNOSIS — I442 Atrioventricular block, complete: Secondary | ICD-10-CM

## 2019-03-25 LAB — CUP PACEART REMOTE DEVICE CHECK
Battery Impedance: 183 Ohm
Battery Voltage: 2.79 V
Brady Statistic AP VS Percent: 0 %
Brady Statistic AS VP Percent: 63 %
Implantable Lead Location: 753859
Implantable Lead Model: 5076
Lead Channel Impedance Value: 417 Ohm
Lead Channel Pacing Threshold Amplitude: 1 V
Lead Channel Pacing Threshold Pulse Width: 0.4 ms
Lead Channel Setting Pacing Amplitude: 2.5 V
Lead Channel Setting Pacing Pulse Width: 0.4 ms
MDC IDC LEAD IMPLANT DT: 20081205
MDC IDC LEAD IMPLANT DT: 20081205
MDC IDC LEAD LOCATION: 753860
MDC IDC MSMT BATTERY REMAINING LONGEVITY: 113 mo
MDC IDC MSMT LEADCHNL RV IMPEDANCE VALUE: 513 Ohm
MDC IDC MSMT LEADCHNL RV PACING THRESHOLD AMPLITUDE: 1.125 V
MDC IDC MSMT LEADCHNL RV PACING THRESHOLD PULSEWIDTH: 0.4 ms
MDC IDC PG IMPLANT DT: 20161115
MDC IDC SESS DTM: 20200326101442
MDC IDC SET LEADCHNL RA PACING AMPLITUDE: 2 V
MDC IDC SET LEADCHNL RV SENSING SENSITIVITY: 4 mV
MDC IDC STAT BRADY AP VP PERCENT: 37 %
MDC IDC STAT BRADY AS VS PERCENT: 0 %

## 2019-03-30 ENCOUNTER — Encounter: Payer: Self-pay | Admitting: Cardiology

## 2019-03-30 NOTE — Progress Notes (Signed)
Remote pacemaker transmission.   

## 2019-06-04 ENCOUNTER — Encounter: Payer: Medicare Other | Admitting: Internal Medicine

## 2019-06-09 ENCOUNTER — Telehealth: Payer: Self-pay | Admitting: *Deleted

## 2019-06-09 NOTE — Telephone Encounter (Signed)
The patient verbally consented for a telehealth phone visit with [facility] and understands that his/her insurance company will be billed for the encounter.  No vitals from home, thinks weight may be 208lb.  Medications reviewed & update made to thyroid medication.    He does not hear will so wife will help with telephone call.

## 2019-06-11 ENCOUNTER — Telehealth (INDEPENDENT_AMBULATORY_CARE_PROVIDER_SITE_OTHER): Payer: Medicare Other | Admitting: Internal Medicine

## 2019-06-11 ENCOUNTER — Encounter: Payer: Self-pay | Admitting: Internal Medicine

## 2019-06-11 DIAGNOSIS — I10 Essential (primary) hypertension: Secondary | ICD-10-CM | POA: Diagnosis not present

## 2019-06-11 DIAGNOSIS — I442 Atrioventricular block, complete: Secondary | ICD-10-CM | POA: Diagnosis not present

## 2019-06-11 NOTE — Progress Notes (Signed)
Electrophysiology TeleHealth Note   Due to national recommendations of social distancing due to [place] 19, an audio telehealth visit is felt to be most appropriate for this patient at this time.  Verbal consent was obtained by me for the telehealth visit today.  The patient does not have capability for a virtual visit.  A phone visit is therefore required today.   Date:  06/11/2019   ID:  [name], DOB 06-21-1933, MRN [id number]  Location: patient's home  Provider location:  [facility]  Evaluation Performed: Follow-up visit  PCP:  Octavio Graves, DO   Electrophysiologist:  Dr Rayann Heman  Chief Complaint:  palpitations  History of Present Illness:    [name] is a 85 y.o. male who presents via telehealth conferencing today.  Since last being seen in our clinic, the patient reports doing very well.  Today, he denies symptoms of palpitations,  shortness of breath,  lower extremity edema, dizziness, presyncope, or syncope.  He has rare and stable angina.  He is very active.  The patient is otherwise without complaint today.  The patient denies symptoms of fevers, chills, cough, or new SOB worrisome for COVID 19.  Past Medical History:  Diagnosis Date  . Aortic stenosis    mild  . Complete heart block (HCC)    s/p PPM  . Diabetes mellitus   . HL (hearing loss)   . HTN (hypertension)   . Malaise and fatigue     Past Surgical History:  Procedure Laterality Date  . EP IMPLANTABLE DEVICE N/A 11/14/2015   Procedure: PPM Generator Changeout;  Surgeon: Thompson Grayer, MD; Medtronic Adapta L  . HEMORRHOID SURGERY    . medtronic senia  12/04/07   PPM implantaed by [facility]    Current Outpatient Medications  Medication Sig Dispense Refill  . amLODipine (NORVASC) 10 MG tablet Take 10 mg by mouth daily.      Marland Kitchen aspirin (ASPIR-LOW) 81 MG EC tablet Take 81 mg by mouth daily.      . Cholecalciferol (VITAMIN D3) 5000 UNITS CAPS Take by mouth daily.      . Cyanocobalamin (VITAMIN  B-12 PO) Take by mouth daily.    Marland Kitchen doxazosin (CARDURA) 4 MG tablet Take 4 mg by mouth daily.      . insulin glargine (LANTUS) 100 UNIT/ML injection Inject 40 Units into the skin every evening.     Marland Kitchen levothyroxine (SYNTHROID) 75 MCG tablet Take 75 mcg by mouth. Takes by mouth on Monday, Wednesday, Friday    . levothyroxine (SYNTHROID, LEVOTHROID) 50 MCG tablet Take 50 mcg by mouth. Takes by mouth on Tuesday, Thursday, Saturday, Sunday    . losartan (COZAAR) 100 MG tablet Take 100 mg by mouth daily.      . metFORMIN (GLUMETZA) 1000 MG (MOD) 24 hr tablet Take 1,000 mg by mouth 2 (two) times daily.      . Multiple Vitamins-Minerals (MULTIVITAL) tablet Take 1 tablet by mouth daily.      . Multiple Vitamins-Minerals (PRESERVISION AREDS) CAPS Take 1 capsule by mouth 2 (two) times daily.     . pantoprazole (PROTONIX) 40 MG tablet Take 40 mg by mouth daily.    . simvastatin (ZOCOR) 20 MG tablet Take 20 mg by mouth daily.    . tamsulosin (FLOMAX) 0.4 MG CAPS capsule Take 0.4 mg by mouth.    . travoprost, benzalkonium, (TRAVATAN Z) 0.004 % ophthalmic solution Place 1 drop into both eyes at bedtime.  No current facility-administered medications for this visit.     Allergies:   Aspirin and Codeine   Social History:  The patient  reports that he quit smoking about 37 years ago. He has never used smokeless tobacco. He reports that he does not drink alcohol.   Family History:  The patient's  family history includes Hypertension in an other family member.   ROS:  Please see the history of present illness.   All other systems are personally reviewed and negative.    Exam:    Vital Signs:  BP (!) 153/96   Well sounding today   Labs/Other Tests and Data Reviewed:    Recent Labs: No results found for requested labs within last 8760 hours.   Wt Readings from Last 3 Encounters:  06/12/18 216 lb 6.4 oz (98.2 kg)  06/20/17 222 lb (100.7 kg)  06/05/16 224 lb (101.6 kg)     Last device remote is  reviewed from PaceART PDF which reveals normal device function, no sustained arrhythmias   ASSESSMENT & PLAN:    1.  Symptomatic complete heart block Normal pacemaker remote reviewed No changes  2. HTN Stable No change required today  3. Moderate AS No symptoms No change   Follow-up:  12 months with me in the office Continue remotes in the interim  Patient Risk:  after full review of this patients clinical status, I feel that they are at moderate risk at this time.  Today, I have spent 15 minutes with the patient with telehealth technology discussing arrhythmia management .    Randolm IdolSigned, Naomii Kreger, MD  06/11/2019 10:52 AM     [facility] [address] [place] [address] [phone] (office) [phone] (fax)

## 2019-06-24 ENCOUNTER — Ambulatory Visit (INDEPENDENT_AMBULATORY_CARE_PROVIDER_SITE_OTHER): Payer: Medicare Other | Admitting: *Deleted

## 2019-06-24 DIAGNOSIS — I442 Atrioventricular block, complete: Secondary | ICD-10-CM | POA: Diagnosis not present

## 2019-06-24 LAB — CUP PACEART REMOTE DEVICE CHECK
Battery Impedance: 207 Ohm
Battery Remaining Longevity: 108 mo
Battery Voltage: 2.79 V
Brady Statistic AP VP Percent: 38 %
Brady Statistic AP VS Percent: 0 %
Brady Statistic AS VP Percent: 62 %
Brady Statistic AS VS Percent: 0 %
Date Time Interrogation Session: 20200625112437
Implantable Lead Implant Date: 20081205
Implantable Lead Implant Date: 20081205
Implantable Lead Location: 753859
Implantable Lead Location: 753860
Implantable Lead Model: 5076
Implantable Lead Model: 5076
Implantable Pulse Generator Implant Date: 20161115
Lead Channel Impedance Value: 418 Ohm
Lead Channel Impedance Value: 491 Ohm
Lead Channel Pacing Threshold Amplitude: 1 V
Lead Channel Pacing Threshold Amplitude: 1.125 V
Lead Channel Pacing Threshold Pulse Width: 0.4 ms
Lead Channel Pacing Threshold Pulse Width: 0.4 ms
Lead Channel Sensing Intrinsic Amplitude: 1.4 mV
Lead Channel Setting Pacing Amplitude: 2 V
Lead Channel Setting Pacing Amplitude: 2.5 V
Lead Channel Setting Pacing Pulse Width: 0.4 ms
Lead Channel Setting Sensing Sensitivity: 4 mV

## 2019-07-01 ENCOUNTER — Encounter: Payer: Self-pay | Admitting: Cardiology

## 2019-07-01 NOTE — Progress Notes (Signed)
Remote pacemaker transmission.   

## 2019-09-27 ENCOUNTER — Encounter: Payer: Medicare Other | Admitting: *Deleted

## 2019-10-07 ENCOUNTER — Ambulatory Visit (INDEPENDENT_AMBULATORY_CARE_PROVIDER_SITE_OTHER): Payer: Medicare Other | Admitting: *Deleted

## 2019-10-07 DIAGNOSIS — I442 Atrioventricular block, complete: Secondary | ICD-10-CM

## 2019-10-07 LAB — CUP PACEART REMOTE DEVICE CHECK
Battery Impedance: 231 Ohm
Battery Remaining Longevity: 100 mo
Battery Voltage: 2.79 V
Brady Statistic AP VP Percent: 39 %
Brady Statistic AP VS Percent: 0 %
Brady Statistic AS VP Percent: 61 %
Brady Statistic AS VS Percent: 0 %
Date Time Interrogation Session: 20201008150304
Implantable Lead Implant Date: 20081205
Implantable Lead Implant Date: 20081205
Implantable Lead Location: 753859
Implantable Lead Location: 753860
Implantable Lead Model: 5076
Implantable Lead Model: 5076
Implantable Pulse Generator Implant Date: 20161115
Lead Channel Impedance Value: 429 Ohm
Lead Channel Impedance Value: 497 Ohm
Lead Channel Pacing Threshold Amplitude: 1 V
Lead Channel Pacing Threshold Amplitude: 1.375 V
Lead Channel Pacing Threshold Pulse Width: 0.4 ms
Lead Channel Pacing Threshold Pulse Width: 0.4 ms
Lead Channel Setting Pacing Amplitude: 2 V
Lead Channel Setting Pacing Amplitude: 2.75 V
Lead Channel Setting Pacing Pulse Width: 0.4 ms
Lead Channel Setting Sensing Sensitivity: 4 mV

## 2019-10-08 ENCOUNTER — Encounter: Payer: Self-pay | Admitting: Cardiology

## 2019-10-08 NOTE — Progress Notes (Signed)
Remote pacemaker transmission.   

## 2020-01-06 ENCOUNTER — Ambulatory Visit (INDEPENDENT_AMBULATORY_CARE_PROVIDER_SITE_OTHER): Payer: Medicare Other | Admitting: *Deleted

## 2020-01-06 DIAGNOSIS — I442 Atrioventricular block, complete: Secondary | ICD-10-CM | POA: Diagnosis not present

## 2020-01-07 ENCOUNTER — Telehealth: Payer: Self-pay

## 2020-01-07 LAB — CUP PACEART REMOTE DEVICE CHECK
Battery Impedance: 255 Ohm
Battery Remaining Longevity: 102 mo
Battery Voltage: 2.78 V
Brady Statistic AP VP Percent: 40 %
Brady Statistic AP VS Percent: 0 %
Brady Statistic AS VP Percent: 60 %
Brady Statistic AS VS Percent: 0 %
Date Time Interrogation Session: 20210108113759
Implantable Lead Implant Date: 20081205
Implantable Lead Implant Date: 20081205
Implantable Lead Location: 753859
Implantable Lead Location: 753860
Implantable Lead Model: 5076
Implantable Lead Model: 5076
Implantable Pulse Generator Implant Date: 20161115
Lead Channel Impedance Value: 434 Ohm
Lead Channel Impedance Value: 510 Ohm
Lead Channel Pacing Threshold Amplitude: 1.125 V
Lead Channel Pacing Threshold Amplitude: 1.125 V
Lead Channel Pacing Threshold Pulse Width: 0.4 ms
Lead Channel Pacing Threshold Pulse Width: 0.4 ms
Lead Channel Setting Pacing Amplitude: 2.25 V
Lead Channel Setting Pacing Amplitude: 2.5 V
Lead Channel Setting Pacing Pulse Width: 0.4 ms
Lead Channel Setting Sensing Sensitivity: 4 mV

## 2020-01-07 NOTE — Telephone Encounter (Signed)
Spoke with patient to remind of missed remote transmission 

## 2020-04-06 ENCOUNTER — Ambulatory Visit (INDEPENDENT_AMBULATORY_CARE_PROVIDER_SITE_OTHER): Payer: Medicare Other | Admitting: *Deleted

## 2020-04-06 DIAGNOSIS — I442 Atrioventricular block, complete: Secondary | ICD-10-CM

## 2020-04-06 LAB — CUP PACEART REMOTE DEVICE CHECK
Battery Impedance: 279 Ohm
Battery Remaining Longevity: 99 mo
Battery Voltage: 2.79 V
Brady Statistic AP VP Percent: 40 %
Brady Statistic AP VS Percent: 0 %
Brady Statistic AS VP Percent: 60 %
Brady Statistic AS VS Percent: 0 %
Date Time Interrogation Session: 20210408112347
Implantable Lead Implant Date: 20081205
Implantable Lead Implant Date: 20081205
Implantable Lead Location: 753859
Implantable Lead Location: 753860
Implantable Lead Model: 5076
Implantable Lead Model: 5076
Implantable Pulse Generator Implant Date: 20161115
Lead Channel Impedance Value: 446 Ohm
Lead Channel Impedance Value: 499 Ohm
Lead Channel Pacing Threshold Amplitude: 1 V
Lead Channel Pacing Threshold Amplitude: 1.25 V
Lead Channel Pacing Threshold Pulse Width: 0.4 ms
Lead Channel Pacing Threshold Pulse Width: 0.4 ms
Lead Channel Setting Pacing Amplitude: 2 V
Lead Channel Setting Pacing Amplitude: 2.5 V
Lead Channel Setting Pacing Pulse Width: 0.4 ms
Lead Channel Setting Sensing Sensitivity: 4 mV

## 2020-04-06 NOTE — Progress Notes (Signed)
PPM Remote  

## 2020-05-26 ENCOUNTER — Encounter: Payer: Self-pay | Admitting: Internal Medicine

## 2020-05-26 ENCOUNTER — Other Ambulatory Visit: Payer: Self-pay

## 2020-05-26 ENCOUNTER — Ambulatory Visit (INDEPENDENT_AMBULATORY_CARE_PROVIDER_SITE_OTHER): Payer: Medicare Other | Admitting: Internal Medicine

## 2020-05-26 VITALS — BP 100/60 | HR 70 | Ht 71.0 in | Wt 194.2 lb

## 2020-05-26 DIAGNOSIS — I350 Nonrheumatic aortic (valve) stenosis: Secondary | ICD-10-CM | POA: Diagnosis not present

## 2020-05-26 DIAGNOSIS — I442 Atrioventricular block, complete: Secondary | ICD-10-CM | POA: Diagnosis not present

## 2020-05-26 DIAGNOSIS — I10 Essential (primary) hypertension: Secondary | ICD-10-CM

## 2020-05-26 LAB — CUP PACEART INCLINIC DEVICE CHECK
Battery Impedance: 279 Ohm
Battery Remaining Longevity: 100 mo
Battery Voltage: 2.79 V
Brady Statistic AP VP Percent: 40 %
Brady Statistic AP VS Percent: 0 %
Brady Statistic AS VP Percent: 60 %
Brady Statistic AS VS Percent: 0 %
Date Time Interrogation Session: 20210528091656
Implantable Lead Implant Date: 20081205
Implantable Lead Implant Date: 20081205
Implantable Lead Location: 753859
Implantable Lead Location: 753860
Implantable Lead Model: 5076
Implantable Lead Model: 5076
Implantable Pulse Generator Implant Date: 20161115
Lead Channel Impedance Value: 422 Ohm
Lead Channel Impedance Value: 532 Ohm
Lead Channel Pacing Threshold Amplitude: 0.875 V
Lead Channel Pacing Threshold Amplitude: 1.125 V
Lead Channel Pacing Threshold Pulse Width: 0.4 ms
Lead Channel Pacing Threshold Pulse Width: 0.4 ms
Lead Channel Sensing Intrinsic Amplitude: 1.4 mV
Lead Channel Setting Pacing Amplitude: 2 V
Lead Channel Setting Pacing Amplitude: 2.5 V
Lead Channel Setting Pacing Pulse Width: 0.4 ms
Lead Channel Setting Sensing Sensitivity: 4 mV

## 2020-05-26 NOTE — Addendum Note (Signed)
Addended by: Eustace Moore on: 05/26/2020 09:47 AM   Modules accepted: Orders

## 2020-05-26 NOTE — Progress Notes (Signed)
PCP: Scotty Court, DO   Primary EP:  Dr Rayann Heman  [name] is a 86 y.o. male who presents today for routine electrophysiology followup.  Since last being seen in our clinic, the patient reports doing very well.  Today, he denies symptoms of palpitations, chest pain, shortness of breath,  lower extremity edema, dizziness, presyncope, or syncope.  The patient is otherwise without complaint today.   Past Medical History:  Diagnosis Date  . Aortic stenosis    mild  . Complete heart block (HCC)    s/p PPM  . Diabetes mellitus   . HL (hearing loss)   . HTN (hypertension)   . Malaise and fatigue    Past Surgical History:  Procedure Laterality Date  . EP IMPLANTABLE DEVICE N/A 11/14/2015   Procedure: PPM Generator Changeout;  Surgeon: Thompson Grayer, MD; Medtronic Adapta L  . HEMORRHOID SURGERY    . medtronic senia  12/04/07   PPM implantaed by [facility]    ROS- all systems are reviewed and negative except as per HPI above  Current Outpatient Medications  Medication Sig Dispense Refill  . amLODipine (NORVASC) 10 MG tablet Take 10 mg by mouth daily.      Marland Kitchen aspirin (ASPIR-LOW) 81 MG EC tablet Take 81 mg by mouth daily.      . Cholecalciferol (VITAMIN D3) 5000 UNITS CAPS Take by mouth daily.      . Cyanocobalamin (VITAMIN B-12 PO) Take by mouth daily.    Marland Kitchen doxazosin (CARDURA) 4 MG tablet Take 4 mg by mouth daily.      Marland Kitchen levothyroxine (SYNTHROID) 75 MCG tablet Take 75 mcg by mouth daily before breakfast.     . losartan (COZAAR) 100 MG tablet Take 100 mg by mouth daily.      . metFORMIN (GLUMETZA) 1000 MG (MOD) 24 hr tablet Take 1,000 mg by mouth 2 (two) times daily.      . Multiple Vitamins-Minerals (MULTIVITAL) tablet Take 1 tablet by mouth daily.      . Multiple Vitamins-Minerals (PRESERVISION AREDS) CAPS Take 1 capsule by mouth 2 (two) times daily.     . pantoprazole (PROTONIX) 40 MG tablet Take 40 mg by mouth daily.    . simvastatin (ZOCOR) 20 MG tablet Take 20 mg by mouth  daily.    . tamsulosin (FLOMAX) 0.4 MG CAPS capsule Take 0.4 mg by mouth.    . travoprost, benzalkonium, (TRAVATAN Z) 0.004 % ophthalmic solution Place 1 drop into both eyes at bedtime.      No current facility-administered medications for this visit.    Physical Exam: Vitals:   05/26/20 0852  BP: 100/60  Pulse: 70  SpO2: 97%  Weight: 194 lb 3.2 oz (88.1 kg)  Height: 5\' 11"  (1.803 m)    GEN- The patient is well appearing, alert and oriented x 3 today.   Head- normocephalic, atraumatic Eyes-  Sclera clear, conjunctiva pink Ears- hearing intact Oropharynx- clear Lungs-  , normal work of breathing Chest- pacemaker pocket is well healed Heart- Regular rate and rhythm, 3/6 SEM LUSB which is late peaking GI- soft  Extremities- no clubbing, cyanosis, or edema  Pacemaker interrogation- reviewed in detail today,  See PACEART report  ekg tracing ordered today is personally reviewed and shows sinus with V pacing  Assessment and Plan:  1. Symptomatic complete heart block Normal pacemaker function See Pace Art report No changes today he is device dependant today  2. Hypertensive cardiovascular disease Stable No change required today  3. Moderate AS SOB with moderate activity Repeat echo  Return to see me in a year  Risks, benefits and potential toxicities for medications prescribed and/or refilled reviewed with patient today.   Hillis Range MD, [name] 05/26/2020 9:36 AM

## 2020-05-26 NOTE — Patient Instructions (Addendum)
Medication Instructions:    Your physician recommends that you continue on your current medications as directed. Please refer to the Current Medication list given to you today.  Labwork:  NONE  Testing/Procedures: Your physician has requested that you have an echocardiogram. Echocardiography is a painless test that uses sound waves to create images of your heart. It provides your doctor with information about the size and shape of your heart and how well your heart's chambers and valves are working. This procedure takes approximately one hour. There are no restrictions for this procedure.  Follow-Up:  Your physician recommends that you schedule a follow-up appointment in: 1 year (office).  Any Other Special Instructions Will Be Listed Below (If Applicable).  If you need a refill on your cardiac medications before your next appointment, please call your pharmacy.

## 2020-06-02 ENCOUNTER — Encounter: Payer: Medicare Other | Admitting: Internal Medicine

## 2020-06-13 ENCOUNTER — Other Ambulatory Visit: Payer: Self-pay

## 2020-06-13 ENCOUNTER — Ambulatory Visit (INDEPENDENT_AMBULATORY_CARE_PROVIDER_SITE_OTHER): Payer: Medicare Other

## 2020-06-13 DIAGNOSIS — I350 Nonrheumatic aortic (valve) stenosis: Secondary | ICD-10-CM | POA: Diagnosis not present

## 2020-07-06 ENCOUNTER — Ambulatory Visit (INDEPENDENT_AMBULATORY_CARE_PROVIDER_SITE_OTHER): Payer: Medicare Other | Admitting: *Deleted

## 2020-07-06 DIAGNOSIS — I442 Atrioventricular block, complete: Secondary | ICD-10-CM | POA: Diagnosis not present

## 2020-07-07 LAB — CUP PACEART REMOTE DEVICE CHECK
Battery Impedance: 305 Ohm
Battery Remaining Longevity: 98 mo
Battery Voltage: 2.79 V
Brady Statistic AP VP Percent: 42 %
Brady Statistic AP VS Percent: 0 %
Brady Statistic AS VP Percent: 58 %
Brady Statistic AS VS Percent: 0 %
Date Time Interrogation Session: 20210709101711
Implantable Lead Implant Date: 20081205
Implantable Lead Implant Date: 20081205
Implantable Lead Location: 753859
Implantable Lead Location: 753860
Implantable Lead Model: 5076
Implantable Lead Model: 5076
Implantable Pulse Generator Implant Date: 20161115
Lead Channel Impedance Value: 446 Ohm
Lead Channel Impedance Value: 566 Ohm
Lead Channel Pacing Threshold Amplitude: 0.875 V
Lead Channel Pacing Threshold Amplitude: 1.125 V
Lead Channel Pacing Threshold Pulse Width: 0.4 ms
Lead Channel Pacing Threshold Pulse Width: 0.4 ms
Lead Channel Setting Pacing Amplitude: 2 V
Lead Channel Setting Pacing Amplitude: 2.5 V
Lead Channel Setting Pacing Pulse Width: 0.4 ms
Lead Channel Setting Sensing Sensitivity: 4 mV

## 2020-07-07 NOTE — Progress Notes (Signed)
Remote pacemaker transmission.   

## 2020-10-05 ENCOUNTER — Ambulatory Visit (INDEPENDENT_AMBULATORY_CARE_PROVIDER_SITE_OTHER): Payer: Medicare Other

## 2020-10-05 DIAGNOSIS — I442 Atrioventricular block, complete: Secondary | ICD-10-CM | POA: Diagnosis not present

## 2020-10-06 LAB — CUP PACEART REMOTE DEVICE CHECK
Battery Impedance: 328 Ohm
Battery Remaining Longevity: 94 mo
Battery Voltage: 2.79 V
Brady Statistic AP VP Percent: 46 %
Brady Statistic AP VS Percent: 0 %
Brady Statistic AS VP Percent: 54 %
Brady Statistic AS VS Percent: 0 %
Date Time Interrogation Session: 20211007141645
Implantable Lead Implant Date: 20081205
Implantable Lead Implant Date: 20081205
Implantable Lead Location: 753859
Implantable Lead Location: 753860
Implantable Lead Model: 5076
Implantable Lead Model: 5076
Implantable Pulse Generator Implant Date: 20161115
Lead Channel Impedance Value: 440 Ohm
Lead Channel Impedance Value: 517 Ohm
Lead Channel Pacing Threshold Amplitude: 0.875 V
Lead Channel Pacing Threshold Amplitude: 1.25 V
Lead Channel Pacing Threshold Pulse Width: 0.4 ms
Lead Channel Pacing Threshold Pulse Width: 0.4 ms
Lead Channel Setting Pacing Amplitude: 2 V
Lead Channel Setting Pacing Amplitude: 2.5 V
Lead Channel Setting Pacing Pulse Width: 0.4 ms
Lead Channel Setting Sensing Sensitivity: 4 mV

## 2020-10-09 NOTE — Progress Notes (Signed)
Remote pacemaker transmission.   

## 2020-12-26 ENCOUNTER — Other Ambulatory Visit: Payer: Self-pay

## 2020-12-26 ENCOUNTER — Encounter: Payer: Self-pay | Admitting: Neurology

## 2020-12-26 ENCOUNTER — Ambulatory Visit (INDEPENDENT_AMBULATORY_CARE_PROVIDER_SITE_OTHER): Payer: Medicare Other | Admitting: Neurology

## 2020-12-26 VITALS — BP 131/77 | HR 91 | Ht 73.0 in | Wt 183.0 lb

## 2020-12-26 DIAGNOSIS — R270 Ataxia, unspecified: Secondary | ICD-10-CM | POA: Diagnosis not present

## 2020-12-26 DIAGNOSIS — I679 Cerebrovascular disease, unspecified: Secondary | ICD-10-CM

## 2020-12-26 DIAGNOSIS — H532 Diplopia: Secondary | ICD-10-CM

## 2020-12-26 DIAGNOSIS — I639 Cerebral infarction, unspecified: Secondary | ICD-10-CM

## 2020-12-26 MED ORDER — CLOPIDOGREL BISULFATE 75 MG PO TABS: 75.0000 mg | ORAL_TABLET | Freq: Every day | ORAL | 11 refills | Status: AC

## 2020-12-26 NOTE — Patient Instructions (Addendum)
I had a long discussion with the patient and his son regarding his diplopia and gait ataxia likely rib being from a brainstem stroke not visualized on the initial CT scan.  I recommend repeating CT scan of the head with and without contrast and checking lipid profile, hemoglobin A1c, ANA panel, ESR and referral for outpatient physical and occupational therapy.  I advised him to use artificial tears in his left eye to avoid infection and to use an eye patch alternatingly for his diplopia.  Continue aspirin 81 mg and add Plavix 75 mg daily for 3 weeks and then stop aspirin and stay on Plavix alone for secondary stroke prevention.  Maintain aggressive risk factor modification with strict control of hypertension with blood pressure goal below 130/90, lipids with LDL cholesterol goal below 70 mg percent and diabetes with hemoglobin A1c goal below 6.5%.  I recommend he use his cane at all times and we discussed fall safety precautions.  He will return for follow-up in the future in 2 months or call earlier if necessary.  Fall Prevention in the Home, Adult Falls can cause injuries. They can happen to people of all ages. There are many things you can do to make your home safe and to help prevent falls. Ask for help when making these changes, if needed. What actions can I take to prevent falls? General Instructions  Use good lighting in all rooms. Replace any light bulbs that burn out.  Turn on the lights when you go into a dark area. Use night-lights.  Keep items that you use often in easy-to-reach places. Lower the shelves around your home if necessary.  Set up your furniture so you have a clear path. Avoid moving your furniture around.  Do not have throw rugs and other things on the floor that can make you trip.  Avoid walking on wet floors.  If any of your floors are uneven, fix them.  Add color or contrast paint or tape to clearly mark and help you see: ? Any grab bars or handrails. ? First and  last steps of stairways. ? Where the edge of each step is.  If you use a stepladder: ? Make sure that it is fully opened. Do not climb a closed stepladder. ? Make sure that both sides of the stepladder are locked into place. ? Ask someone to hold the stepladder for you while you use it.  If there are any pets around you, be aware of where they are. What can I do in the bathroom?      Keep the floor dry. Clean up any water that spills onto the floor as soon as it happens.  Remove soap buildup in the tub or shower regularly.  Use non-skid mats or decals on the floor of the tub or shower.  Attach bath mats securely with double-sided, non-slip rug tape.  If you need to sit down in the shower, use a plastic, non-slip stool.  Install grab bars by the toilet and in the tub and shower. Do not use towel bars as grab bars. What can I do in the bedroom?  Make sure that you have a light by your bed that is easy to reach.  Do not use any sheets or blankets that are too big for your bed. They should not hang down onto the floor.  Have a firm chair that has side arms. You can use this for support while you get dressed. What can I do in the kitchen?  Clean up any spills right away.  If you need to reach something above you, use a strong step stool that has a grab bar.  Keep electrical cords out of the way.  Do not use floor polish or wax that makes floors slippery. If you must use wax, use non-skid floor wax. What can I do with my stairs?  Do not leave any items on the stairs.  Make sure that you have a light switch at the top of the stairs and the bottom of the stairs. If you do not have them, ask someone to add them for you.  Make sure that there are handrails on both sides of the stairs, and use them. Fix handrails that are broken or loose. Make sure that handrails are as long as the stairways.  Install non-slip stair treads on all stairs in your home.  Avoid having throw rugs  at the top or bottom of the stairs. If you do have throw rugs, attach them to the floor with carpet tape.  Choose a carpet that does not hide the edge of the steps on the stairway.  Check any carpeting to make sure that it is firmly attached to the stairs. Fix any carpet that is loose or worn. What can I do on the outside of my home?  Use bright outdoor lighting.  Regularly fix the edges of walkways and driveways and fix any cracks.  Remove anything that might make you trip as you walk through a door, such as a raised step or threshold.  Trim any bushes or trees on the path to your home.  Regularly check to see if handrails are loose or broken. Make sure that both sides of any steps have handrails.  Install guardrails along the edges of any raised decks and porches.  Clear walking paths of anything that might make someone trip, such as tools or rocks.  Have any leaves, snow, or ice cleared regularly.  Use sand or salt on walking paths during winter.  Clean up any spills in your garage right away. This includes grease or oil spills. What other actions can I take?  Wear shoes that: ? Have a low heel. Do not wear high heels. ? Have rubber bottoms. ? Are comfortable and fit you well. ? Are closed at the toe. Do not wear open-toe sandals.  Use tools that help you move around (mobility aids) if they are needed. These include: ? Canes. ? Walkers. ? Scooters. ? Crutches.  Review your medicines with your doctor. Some medicines can make you feel dizzy. This can increase your chance of falling. Ask your doctor what other things you can do to help prevent falls. Where to find more information  Centers for Disease Control and Prevention, STEADI: https://garcia.biz/  [employer] on Aging: BrainJudge.co.uk Contact a doctor if:  You are afraid of falling at home.  You feel weak, drowsy, or dizzy at home.  You fall at home. Summary  There are many simple things  that you can do to make your home safe and to help prevent falls.  Ways to make your home safe include removing tripping hazards and installing grab bars in the bathroom.  Ask for help when making these changes in your home. This information is not intended to replace advice given to you by your health care provider. Make sure you discuss any questions you have with your health care provider. Document Revised: 04/08/2019 Document Reviewed: 07/31/2017 Elsevier Patient Education  2020 [employer].  Stroke Prevention Some medical conditions and behaviors are associated with a higher chance of having a stroke. You can help prevent a stroke by making nutrition, lifestyle, and other changes, including managing any medical conditions you may have. What nutrition changes can be made?   Eat healthy foods. You can do this by: ? Choosing foods high in fiber, such as fresh fruits and vegetables and whole grains. ? Eating at least 5 or more servings of fruits and vegetables a day. Try to fill half of your plate at each meal with fruits and vegetables. ? Choosing lean protein foods, such as lean cuts of meat, poultry without skin, fish, tofu, beans, and nuts. ? Eating low-fat dairy products. ? Avoiding foods that are high in salt (sodium). This can help lower blood pressure. ? Avoiding foods that have saturated fat, trans fat, and cholesterol. This can help prevent high cholesterol. ? Avoiding processed and premade foods.  Follow your health care provider's specific guidelines for losing weight, controlling high blood pressure (hypertension), lowering high cholesterol, and managing diabetes. These may include: ? Reducing your daily calorie intake. ? Limiting your daily sodium intake to 1,500 milligrams (mg). ? Using only healthy fats for cooking, such as olive oil, canola oil, or sunflower oil. ? Counting your daily carbohydrate intake. What lifestyle changes can be made?  Maintain a healthy  weight. Talk to your health care provider about your ideal weight.  Get at least 30 minutes of moderate physical activity at least 5 days a week. Moderate activity includes brisk walking, biking, and swimming.  Do not use any products that contain nicotine or tobacco, such as cigarettes and e-cigarettes. If you need help quitting, ask your health care provider. It may also be helpful to avoid exposure to secondhand smoke.  Limit alcohol intake to no more than 1 drink a day for nonpregnant women and 2 drinks a day for men. One drink equals 12 oz of beer, 5 oz of wine, or 1 oz of hard liquor.  Stop any illegal drug use.  Avoid taking birth control pills. Talk to your health care provider about the risks of taking birth control pills if: ? You are over 78 years old. ? You smoke. ? You get migraines. ? You have ever had a blood clot. What other changes can be made?  Manage your cholesterol levels. ? Eating a healthy diet is important for preventing high cholesterol. If cholesterol cannot be managed through diet alone, you may also need to take medicines. ? Take any prescribed medicines to control your cholesterol as told by your health care provider.  Manage your diabetes. ? Eating a healthy diet and exercising regularly are important parts of managing your blood sugar. If your blood sugar cannot be managed through diet and exercise, you may need to take medicines. ? Take any prescribed medicines to control your diabetes as told by your health care provider.  Control your hypertension. ? To reduce your risk of stroke, try to keep your blood pressure below 130/80. ? Eating a healthy diet and exercising regularly are an important part of controlling your blood pressure. If your blood pressure cannot be managed through diet and exercise, you may need to take medicines. ? Take any prescribed medicines to control hypertension as told by your health care provider. ? Ask your health care provider  if you should monitor your blood pressure at home. ? Have your blood pressure checked every year, even if your blood pressure is normal. Blood pressure  increases with age and some medical conditions.  Get evaluated for sleep disorders (sleep apnea). Talk to your health care provider about getting a sleep evaluation if you snore a lot or have excessive sleepiness.  Take over-the-counter and prescription medicines only as told by your health care provider. Aspirin or blood thinners (antiplatelets or anticoagulants) may be recommended to reduce your risk of forming blood clots that can lead to stroke.  Make sure that any other medical conditions you have, such as atrial fibrillation or atherosclerosis, are managed. What are the warning signs of a stroke? The warning signs of a stroke can be easily remembered as BEFAST.  B is for balance. Signs include: ? Dizziness. ? Loss of balance or coordination. ? Sudden trouble walking.  E is for eyes. Signs include: ? A sudden change in vision. ? Trouble seeing.  F is for face. Signs include: ? Sudden weakness or numbness of the face. ? The face or eyelid drooping to one side.  A is for arms. Signs include: ? Sudden weakness or numbness of the arm, usually on one side of the body.  S is for speech. Signs include: ? Trouble speaking (aphasia). ? Trouble understanding.  T is for time. ? These symptoms may represent a serious problem that is an emergency. Do not wait to see if the symptoms will go away. Get medical help right away. Call your local emergency services (911 in the [place] Do not drive yourself to the hospital.  Other signs of stroke may include: ? A sudden, severe headache with no known cause. ? Nausea or vomiting. ? Seizure. Where to find more information For more information, visit:  American Stroke Association: www.strokeassociation.org  National Stroke Association: www.stroke.org Summary  You can prevent a stroke by  eating healthy, exercising, not smoking, limiting alcohol intake, and managing any medical conditions you may have.  Do not use any products that contain nicotine or tobacco, such as cigarettes and e-cigarettes. If you need help quitting, ask your health care provider. It may also be helpful to avoid exposure to secondhand smoke.  Remember BEFAST for warning signs of stroke. Get help right away if you or a loved one has any of these signs. This information is not intended to replace advice given to you by your health care provider. Make sure you discuss any questions you have with your health care provider. Document Revised: 11/28/2017 Document Reviewed: 01/21/2017 Elsevier Patient Education  2020 [employer].

## 2020-12-26 NOTE — Progress Notes (Signed)
[facility] [address] [place].  [id number] [phone]       OFFICE CONSULT NOTE  Mr. [name] Date of Birth:  January 05, 1933 Medical Record Number:  [id number]   Referring MD:  Louanne Belton Reason for Referral: Diplopia HPI: Mr. Sainsbury is a 86 year old Caucasian male seen today for initial office consultation visit first diplopia.  History is obtained from the patient and son and review of electronic medical records in [place].  Actual films were not available but imaging reports were available in PACS for review. He has past medical history of pacemaker for complete heart block, diabetes, hypertension and aortic stenosis.  He presented to [facility] on 12/18/2020 with sudden onset of diplopia which developed about 2 to 3 days prior to admission.  He complained of double vision when looking through both eyes.  On exam he was found to have ophthalmoplegia with right eye laterally rotated with some drooping.  He was diagnosed with right 3rd nerve palsy.  CT scan of the head and neck were performed in the ED which showed no large vessel stenosis or occlusion or aneurysms.  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  He did not want to stay in the hospital to undergo further testing and was discharged from the ED and referred for outpatient neurology evaluation.  Patient denies any headache.  He has an eye patch covering his right eye which helps his diplopia but he feels his balance is slightly off.  Lives at home with his wife and was independent prior to this.  He denies any extremity weakness numbness or speech difficulties.  He is not sure if his pacemaker is MRI compatible..  On inquiry admits to inability to close his left eye with a feeling of dryness in the left eye. ROS:   14 system review of systems is positive for diplopia, eye closure weakness, ataxia,  dizziness, gait imbalance all other systems negative PMH:  Past Medical History:  Diagnosis Date  . Aortic stenosis    mild  . Complete heart block (HCC)    s/p PPM  . Diabetes mellitus   . HL (hearing loss)   . HTN (hypertension)   . Malaise and fatigue     Social History:  Social History   Socioeconomic History  . Marital status: Married    Spouse name: Patsy  . Number of children: Not on file  . Years of education: Not on file  . Highest education level: Not on file  Occupational History  . Not on file  Tobacco Use  . Smoking status: Former Smoker    Quit date: 02/20/1982    Years since quitting: 38.8  . Smokeless tobacco: Never Used  Substance and Sexual Activity  . Alcohol use: No    Alcohol/week: 0.0 standard drinks  . Drug use: Not on file  . Sexual activity: Not on file  Other Topics Concern  . Not on file  Social History Narrative   Lives with Wife, Patsy   Right Handed   Drinks no caffeine   Social Determinants of Health   Financial Resource Strain: Not on file  Food Insecurity: Not on file  Transportation Needs: Not on file  Physical Activity: Not on file  Stress: Not on file  Social Connections: Not on file  Intimate Partner Violence: Not on file    Medications:   Current Outpatient Medications on File Prior  to Visit  Medication Sig Dispense Refill  . amLODipine (NORVASC) 10 MG tablet Take 10 mg by mouth daily.    Marland Kitchen aspirin 81 MG EC tablet Take 81 mg by mouth daily.    . Cholecalciferol (VITAMIN D3) 5000 UNITS CAPS Take by mouth daily.    . Cyanocobalamin (VITAMIN B-12 PO) Take by mouth daily.    Marland Kitchen levothyroxine (SYNTHROID) 75 MCG tablet Take 75 mcg by mouth daily before breakfast.     . losartan (COZAAR) 100 MG tablet Take 100 mg by mouth daily.    . metFORMIN (GLUMETZA) 1000 MG (MOD) 24 hr tablet Take 1,000 mg by mouth 2 (two) times daily.    . Multiple Vitamins-Minerals (MULTIVITAL) tablet Take 1 tablet by mouth daily.    . Multiple  Vitamins-Minerals (PRESERVISION AREDS) CAPS Take 1 capsule by mouth 2 (two) times daily.    . pantoprazole (PROTONIX) 40 MG tablet Take 40 mg by mouth daily.    . simvastatin (ZOCOR) 20 MG tablet Take 20 mg by mouth daily.    . tamsulosin (FLOMAX) 0.4 MG CAPS capsule Take 0.4 mg by mouth.    . travoprost, benzalkonium, (TRAVATAN Z) 0.004 % ophthalmic solution Place 1 drop into both eyes at bedtime.    Marland Kitchen doxazosin (CARDURA) 4 MG tablet Take 4 mg by mouth daily.     No current facility-administered medications on file prior to visit.    Allergies:   Allergies  Allergen Reactions  . Aspirin     Upset stomach with full strength  . Codeine     REACTION: syncope    Physical Exam General: Frail elderly Caucasian male seated, in no evident distress Head: head normocephalic and atraumatic.   Neck: supple with no carotid or supraclavicular bruits Cardiovascular: regular rate and rhythm, no murmurs Musculoskeletal: Severe kyphoscoliosis Skin:  no rash/petichiae Vascular:  Normal pulses all extremities  Neurologic Exam Mental Status: Awake and fully alert. Oriented to place and time. Recent and remote memory intact. Attention span, concentration and fund of knowledge appropriate. Mood and affect appropriate.  Cranial Nerves: Fundoscopic exam reveals sharp disc margins. Pupils equal, briskly reactive to light.  Right eye ptosis with only lateral movement preserved in the right eye.  Left eye weakness of adduction but preserved other movements.  Slight nystagmus on right lateral gaze.  Visual fields full to confrontation. Hearing diminished bilaterally facial sensation intact.  Complete peripheral left 7th nerve palsy with weakness of forehead, eye closure and cheek muscles.  Tongue, palate moves normally and symmetrically.  Motor: Normal bulk and tone. Normal strength in all tested extremity muscles. Sensory.: intact to touch , pinprick , position and vibratory sensation.  Coordination: Rapid  alternating movements normal in all extremities. Finger-to-nose and heel-to-shin performed accurately bilaterally. Gait and Station: Arises from chair with slight difficulty. Stance is stooped. Gait demonstrates mild ataxia and uses a cane.  Unsteady on a narrow base.   Reflexes: 1+ and symmetric. Toes downgoing.   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  Other etiologies like meningitis would be less likely in the absence of significant headaches or other associated symptoms.  Diabetes multiple cranial neuropathy is also possible.   PLAN: I  had a long discussion with the patient and his son regarding his diplopia and gait ataxia and discussed differential diagnosis which includes brainstem stroke versus other brainstem lesions or diabetes poorly cranial neuropathy.  I recommend repeating CT scan of the head with and without contrast and checking lipid profile, hemoglobin A1c, ANA panel, ESR and referral for outpatient physical and occupational therapy.  I advised him to use artificial tears in his left eye to avoid infection and to use an eye patch alternatingly for his diplopia.  Continue aspirin 81 mg and add Plavix 75 mg daily for 3 weeks and then stop aspirin and stay on Plavix alone for secondary stroke prevention.  Maintain aggressive risk factor modification with strict control of hypertension with blood pressure goal below 130/90, lipids with LDL cholesterol goal below 70 mg percent and diabetes with hemoglobin A1c goal below 6.5%.  I recommend he use his cane at all times and we discussed fall safety precautions.  He will return for follow-up in the  future in 2 months or call earlier if necessary.  Greater than 50% time during this 45-minute consultation was it was spent on counseling and coordination of care about his diplopia and ataxia and discussion about brainstem stroke and answering questions. Antony Contras, MD Note: This document was prepared with digital dictation and possible smart phrase technology. Any transcriptional errors that result from this process are unintentional.

## 2020-12-27 ENCOUNTER — Telehealth: Payer: Self-pay | Admitting: Emergency Medicine

## 2020-12-27 LAB — ANA COMPREHENSIVE PANEL
Anti JO-1: <0.2 AI (ref 0.0–0.9)
Centromere Ab Screen: <0.2 AI (ref 0.0–0.9)
Chromatin Ab SerPl-aCnc: <0.2 AI (ref 0.0–0.9)
ENA RNP Ab: 0.2 AI (ref 0.0–0.9)
ENA SM Ab Ser-aCnc: <0.2 AI (ref 0.0–0.9)
ENA SSA (RO) Ab: <0.2 AI (ref 0.0–0.9)
ENA SSB (LA) Ab: <0.2 AI (ref 0.0–0.9)
Scleroderma (Scl-70) (ENA) Antibody, IgG: <0.2 AI (ref 0.0–0.9)
dsDNA Ab: <1 IU/mL (ref 0–9)

## 2020-12-27 LAB — SEDIMENTATION RATE: Sed Rate: 19 mm/hr (ref 0–30)

## 2020-12-27 LAB — LIPID PANEL
Chol/HDL Ratio: 2.7 ratio (ref 0.0–5.0)
Cholesterol, Total: 96 mg/dL — ABNORMAL LOW (ref 100–199)
HDL: 36 mg/dL — ABNORMAL LOW (ref >39)
LDL Chol Calc (NIH): 36 mg/dL (ref 0–99)
Triglycerides: 137 mg/dL (ref 0–149)
VLDL Cholesterol Cal: 24 mg/dL (ref 5–40)

## 2020-12-27 LAB — HEMOGLOBIN A1C
Est. average glucose Bld gHb Est-mCnc: 134 mg/dL
Hgb A1c MFr Bld: 6.3 % — ABNORMAL HIGH (ref 4.8–5.6)

## 2020-12-27 NOTE — Progress Notes (Signed)
Kindly inform the patient that lab work for lupus and vasculitis was negative

## 2020-12-27 NOTE — Progress Notes (Signed)
Kindly inform the patient that not all lab results are back yet but cholesterol profile, screening test for diabetes and sed rate are all satisfactory

## 2020-12-27 NOTE — Telephone Encounter (Signed)
Called reached patient's wife and went over Dr. Marlis Edelson findings of patient's blood work.  She denied any questions and expressed appreciation.

## 2020-12-27 NOTE — Telephone Encounter (Signed)
-----   Message from Micki Riley, MD sent at 12/27/2020  3:36 PM EST ----- Kindly inform the patient that not all lab results are back yet but cholesterol profile, screening test for diabetes and sed rate are all satisfactory

## 2020-12-28 ENCOUNTER — Telehealth: Payer: Self-pay | Admitting: Emergency Medicine

## 2020-12-28 NOTE — Telephone Encounter (Signed)
-----   Message from Micki Riley, MD sent at 12/27/2020  4:48 PM EST ----- Kindly inform the patient that lab work for lupus and vasculitis was negative

## 2020-12-28 NOTE — Telephone Encounter (Signed)
Left message to return call 

## 2021-01-01 ENCOUNTER — Telehealth: Payer: Self-pay | Admitting: Emergency Medicine

## 2021-01-01 NOTE — Telephone Encounter (Signed)
-----   Message from Micki Riley, MD sent at 12/27/2020  4:48 PM EST ----- Kindly inform the patient that lab work for lupus and vasculitis was negative

## 2021-01-01 NOTE — Telephone Encounter (Signed)
Called and spoke to patient's wife (on [place]), went over Dr. Marlis Edelson review of blood work results.  Patsy denied any questions and expressed appreciation.

## 2021-01-04 ENCOUNTER — Ambulatory Visit (INDEPENDENT_AMBULATORY_CARE_PROVIDER_SITE_OTHER): Payer: Medicare Other

## 2021-01-04 DIAGNOSIS — I442 Atrioventricular block, complete: Secondary | ICD-10-CM

## 2021-01-06 LAB — CUP PACEART REMOTE DEVICE CHECK
Battery Impedance: 352 Ohm
Battery Remaining Longevity: 90 mo
Battery Voltage: 2.78 V
Brady Statistic AP VP Percent: 47 %
Brady Statistic AP VS Percent: 0 %
Brady Statistic AS VP Percent: 53 %
Brady Statistic AS VS Percent: 0 %
Date Time Interrogation Session: 20220107134559
Implantable Lead Implant Date: 20081205
Implantable Lead Implant Date: 20081205
Implantable Lead Location: 753859
Implantable Lead Location: 753860
Implantable Lead Model: 5076
Implantable Lead Model: 5076
Implantable Pulse Generator Implant Date: 20161115
Lead Channel Impedance Value: 422 Ohm
Lead Channel Impedance Value: 458 Ohm
Lead Channel Pacing Threshold Amplitude: 1 V
Lead Channel Pacing Threshold Amplitude: 1.125 V
Lead Channel Pacing Threshold Pulse Width: 0.4 ms
Lead Channel Pacing Threshold Pulse Width: 0.4 ms
Lead Channel Setting Pacing Amplitude: 2 V
Lead Channel Setting Pacing Amplitude: 2.5 V
Lead Channel Setting Pacing Pulse Width: 0.4 ms
Lead Channel Setting Sensing Sensitivity: 4 mV

## 2021-01-09 ENCOUNTER — Telehealth: Payer: Self-pay

## 2021-01-09 NOTE — Telephone Encounter (Signed)
I connected by phone with [name] and/or patient's caregiver on 01/09/2021 at 1:59 PM to discuss the potential vaccination through our Homebound vaccination initiative.   Prevaccination Checklist for COVID-19 Vaccines  1.  Are you feeling sick today? no  2.  Have you ever received a dose of a COVID-19 vaccine?  yes      If yes, which one? Pfizer   How many dose of Covid-19 vaccine have your received and dates ? 2 doses, 01/27/2020, 02/24/2020   Check all that apply: I live in a long-term care setting. no  I have been diagnosed with a medical condition(s). Please list: CVA (pertinent to homebound status)  I am a first responder. no  I work in a long-term care facility, correctional facility, hospital, restaurant, retail setting, school, or other setting with high exposure to the public. no  4. Do you have a health condition or are you undergoing treatment that makes you moderately or severely immunocompromised? (This would include treatment for cancer or HIV, receipt of organ transplant, immunosuppressive therapy or high-dose corticosteroids, CAR-T-cell therapy, hematopoietic cell transplant [HCT], DiGeorge syndrome or Wiskott-Aldrich syndrome)  no  5. Have you received hematopoietic cell transplant (HCT) or CAR-T-cell therapies since receiving COVID-19 vaccine? no  6.  Have you ever had an allergic reaction: (This would include a severe reaction [ e.g., anaphylaxis] that required treatment with epinephrine or EpiPen or that caused you to go to the hospital.  It would also include an allergic reaction that occurred within 4 hours that caused hives, swelling, or respiratory distress, including wheezing.) A.  A previous dose of COVID-19 vaccine. no  B.  A vaccine or injectable therapy that contains multiple components, one of which is a COVID-19 vaccine component, but it is not known which component elicited the immediate reaction. no  C.  Are you allergic to polyethylene glycol? no  D.  Are you allergic to Polysorbate, which is found in some vaccines, film coated tablets and intravenous steroids?  no   7.  Have you ever had an allergic reaction to another vaccine (other than COVID-19 vaccine) or an injectable medication? (This would include a severe reaction [ e.g., anaphylaxis] that required treatment with epinephrine or EpiPen or that caused you to go to the hospital.  It would also include an allergic reaction that occurred within 4 hours that caused hives, swelling, or respiratory distress, including wheezing.)  no   8.  Have you ever had a severe allergic reaction (e.g., anaphylaxis) to something other than a component of the COVID-19 vaccine, or any vaccine or injectable medication?  This would include food, pet, venom, environmental, or oral medication allergies.  no   Check all that apply to you:  Am a male between ages 51 and 15 years old  no  Women 60 through 85 years of age can receive any FDA-authorized or -approved COVID-19 vaccine. However, they should be informed of the rare but increased risk of thrombosis with thrombocytopenia syndrome (TTS) after receipt of the [employer] Vaccine and the availability of other FDA-authorized and -approved COVID-19 vaccines. People who had TTS after a first dose of Janssen vaccine should not receive a subsequent dose of Janssen product    Am a male between ages 4 and 71 years old  no Males 5 through 85 years of age may receive the correct formulation of Pfizer-BioNTech COVID-19 vaccine. Males 18 and older can receive any FDA-authorized or -approved vaccine. However, people receiving an mRNA COVID-19 vaccine, especially  males 18 through 85 years of age and their parents/legal representative (when relevant), should be informed of the risk of developing myocarditis (an inflammation of the heart muscle) or pericarditis (inflammation of the lining around the heart) after receipt of an mRNA vaccine. The risk of developing either  myocarditis or pericarditis after vaccination is low, and lower than the risk of myocarditis associated with SARS-CoV-2 infection in adolescents and adults. Vaccine recipients should be counseled about the need to seek care if symptoms of myocarditis or pericarditis develop after vaccination     Have a history of myocarditis or pericarditis  no Myocarditis or pericarditis after receipt of the first dose of an mRNA COVID-19 vaccine series but before administration of the second dose  Experts advise that people who develop myocarditis or pericarditis after a dose of an mRNA COVID-19 vaccine not receive a subsequent dose of any COVID-19 vaccine, until additional safety data are available.  Administration of a subsequent dose of COVID-19 vaccine before safety data are available can be considered in certain circumstances after the episode of myocarditis or pericarditis has completely resolved. Until additional data are available, some experts recommend a Linwood Dibbles COVID-19 vaccine be considered instead of an mRNA COVID-19 vaccine. Decisions about proceeding with a subsequent dose should include a conversation between the patient, their parent/legal representative (when relevant), and their clinical team, which may include a cardiologist.    Have been treated with monoclonal antibodies or convalescent serum to prevent or treat COVID-19  no Vaccination should be offered to people regardless of history of prior symptomatic or asymptomatic SARS-CoV-2 infection. There is no recommended minimal interval between infection and vaccination.  However, vaccination should be deferred if a patient received monoclonal antibodies or convalescent serum as treatment for COVID-19 or for post-exposure prophylaxis. This is a precautionary measure until additional information becomes available, to avoid interference of the antibody treatment with vaccine-induced immune responses.  Defer COVID-19 vaccination for 30 days when a  passive antibody product was used for post-exposure prophylaxis.  Defer COVID-19 vaccination for 90 days when a passive antibody product was used to treat COVID-19.     Diagnosed with Multisystem Inflammatory Syndrome (MIS-C or MIS-A) after a COVID-19 infection  no It is unknown if people with a history of MIS-C or MIS-A are at risk for a dysregulated immune response to COVID-19 vaccination.  People with a history of MIS-C or MIS-A may choose to be vaccinated. Considerations for vaccination may include:   Clinical recovery from MIS-C or MIS-A, including return to normal cardiac function   Personal risk of severe acute COVID-19 (e.g., age, underlying conditions)   High or substantial community transmission of SARS-CoV-2 and personal increased risk of reinfection.   Timing of any immunomodulatory therapies (general best practice guidelines for immunization can be consulted for more information [employer])   It has been 90 days or more since their diagnosis of MIS-C   Onset of MIS-C occurred before any COVID-19 vaccination   A conversation between the patient, their guardian(s), and their clinical team or a specialist may assist with COVID-19 vaccination decisions. Healthcare providers and health departments may also request a consultation from the Clinical Immunization Safety Assessment Project at Ord[employer]it vaccinesafety/ensuringsafety/monitoring/cisa/index.html.     Have a bleeding disorder  no Take a blood thinner  yes As with all vaccines, any COVID-19 vaccine product may be given to these patients, if a physician familiar with the patient's bleeding risk determines that the vaccine can be administered intramuscularly with reasonable safety.  ACIP  recommends the following technique for intramuscular vaccination in patients with bleeding disorders or taking blood thinners: a fine-gauge needle (23-gauge or smaller caliber) should be used  for the vaccination, followed by firm pressure on the site, without rubbing, for at least 2 minutes.  People who regularly take aspirin or anticoagulants as part of their routine medications do not need to stop these medications prior to receipt of any COVID-19 vaccine.    Have a history of heparin-induced thrombocytopenia (HIT)  no Although the etiology of TTS associated with the Alphonsa Overall COVID-19 vaccine is unclear, it appears to be similar to another rare immune-mediated syndrome, heparin-induced thrombocytopenia (HIT). People with a history of an episode of an immune-mediated syndrome characterized by thrombosis and thrombocytopenia, such as HIT, should be offered a currently FDA-approved or FDA-authorized mRNA COVID-19 vaccine if it has been ?90 days since their TTS resolved. After 90 days, patients may be vaccinated with any currently FDA-approved or FDA-authorized COVID-19 vaccine, including Janssen COVID-19 Vaccine. However, people who developed TTS after their initial Alphonsa Overall vaccine should not receive a Janssen booster dose.  Experts believe the following factors do not make people more susceptible to TTS after receipt of the [employer]. People with these conditions can be vaccinated with any FDA-authorized or - approved COVID-19 vaccine, including the [employer] COVID-19 Vaccine:   A prior history of venous thromboembolism   Risk factors for venous thromboembolism (e.g., inherited or acquired thrombophilia including Factor V Leiden; prothrombin gene 20210A mutation; antiphospholipid syndrome; protein C, protein S or antithrombin deficiency   A prior history of other types of thromboses not associated with thrombocytopenia   Pregnancy, post-partum status, or receipt of hormonal contraceptives (e.g., combined oral contraceptives, patch, ring)   Additional recipient education materials can be found at http://gutierrez-robinson.com/ vaccines/safety/JJUpdate.html.    Am currently  pregnant or breastfeeding  no Vaccination is recommended for all people aged 63 years and older, including people that are:   Pregnant   Breastfeeding   Trying to get pregnant now or who might become pregnant in the future   Pregnant, breastfeeding, and post-partum people 66 through 85 years of age should be aware of the rare risk of TTS after receipt of the Alphonsa Overall COVID-19 Vaccine and the availability of other FDA-authorized or -approved COVID-19 vaccines (i.e., mRNA vaccines).    Have received dermal fillers  no FDA-authorized or -approved COVID-19 vaccines can be administered to people who have received injectable dermal fillers who have no contraindications for vaccination.  Infrequently, these people might experience temporary swelling at or near the site of filler injection (usually the face or lips) following administration of a dose of an mRNA COVID-19 vaccine. These people should be advised to contact their healthcare provider if swelling develops at or near the site of dermal filler following vaccination.     Have a history of Guillain-Barr Syndrome (GBS)  no People with a history of GBS can receive any FDA-authorized or -approved COVID-19 vaccine. However, given the possible association between the [employer] and an increased risk of GBS, a patient with a history of GBS and their clinical team should discuss the availability of mRNA vaccines to offer protection against COVID-19. The highest risk has been observed in men aged 85-64 years with symptoms of GBS beginning within 42 days after Alphonsa Overall COVID-19 vaccination.  People who had GBS after receiving Janssen vaccine should be made aware of the option to receive an mRNA COVID-19 vaccine booster at least 2 months (8 weeks) after the  Janssen dose. However, Linwood Dibbles vaccine may be used as a booster, particularly if GBS occurred more than 42 days after vaccination or was related to a non-vaccine factor. Prior to booster  vaccination, a conversation between the patient and their clinical team may assist with decisions about use of a COVID-19 booster dose, including the timing of administration     Postvaccination Observation Times for People without Contraindications to Covid 19 Vaccination.  30 minutes:  People with a history of: A contraindication to another type of COVID-19 vaccine product (i.e., mRNA or viral vector COVID-19 vaccines)   Immediate (within 4 hours of exposure) non-severe allergic reaction to a COVID-19 vaccine or injectable therapies   Anaphylaxis due to any cause   Immediate allergic reaction of any severity to a non-COVID-19 vaccine   15 minutes: All other people  This patient is a 87 y.o. male that meets the FDA criteria to receive homebound vaccination. Patient or parent/caregiver understands they have the option to accept or refuse homebound vaccination.  Patient passed the pre-screening checklist and would like to proceed with homebound vaccination.  Based on questionnaire above, I recommend the patient be observed for 15 minutes.  There are an estimated #1 other household members/caregivers who are also interested in receiving the vaccine.    The patient has been confirmed homebound and eligible for homebound vaccination with the considerations outlined above. I will send the patient's information to our scheduling team who will reach out to schedule the patient and potential caregiver/family members for homebound vaccination.    Skip Mayer 01/09/2021 1:59 PM

## 2021-01-18 NOTE — Progress Notes (Signed)
Remote pacemaker transmission.   

## 2021-02-13 ENCOUNTER — Ambulatory Visit: Payer: Medicare Other | Attending: Critical Care Medicine

## 2021-02-13 DIAGNOSIS — Z23 Encounter for immunization: Secondary | ICD-10-CM

## 2021-02-13 NOTE — Progress Notes (Signed)
   Covid-19 Vaccination Clinic  Name:  [name]    MRN: [id number] DOB: 04/15/1933  02/13/2021  [name] was observed post Covid-19 immunization for 15 min without incident. He was provided with Vaccine Information Sheet and instruction to access the V-Safe system.   [name] was instructed to call 911 with any severe reactions post vaccine: Marland Kitchen Difficulty breathing  . Swelling of face and throat  . A fast heartbeat  . A bad rash all over body  . Dizziness and weakness   Immunizations Administered    Name Date Dose VIS Date Route   PFIZER Comrnaty(Gray TOP) Covid-19 Vaccine 02/13/2021 10:55 AM 0.3 mL 12/07/2020 Intramuscular   Manufacturer: [employer], [employer]   Lot: [id number]   NDC: [phone]

## 2021-02-15 ENCOUNTER — Telehealth: Payer: Self-pay | Admitting: Neurology

## 2021-02-15 NOTE — Telephone Encounter (Signed)
Pt.'s son [name] called to ask for documentation from doctor for nursing home to state that dad has memory loss. Please advise.

## 2021-02-16 NOTE — Telephone Encounter (Signed)
Kindly inform the patient's son that I saw  for diplopia and did not evaluate his memory in detail but can do so on follow-up visit

## 2021-02-19 NOTE — Telephone Encounter (Signed)
LM for Son Windy Fast was on DPR.

## 2021-02-22 ENCOUNTER — Ambulatory Visit: Payer: Non-veteran care | Admitting: Neurology

## 2021-02-26 ENCOUNTER — Telehealth: Payer: Self-pay | Admitting: Neurology

## 2021-02-26 DIAGNOSIS — I639 Cerebral infarction, unspecified: Secondary | ICD-10-CM

## 2021-02-26 NOTE — Telephone Encounter (Signed)
Son(on DPR) is asking for something stating pt is suffering from memory loss, this needs to be faxed to Earl Lites @ the facility where pt lives [facility]) please fax to [phone]

## 2021-02-26 NOTE — Telephone Encounter (Signed)
Called Son back at number left.  No answer and did not allow me to leave a message.  Did speak to son Joey at [phone] who was aware of Ron calling this morning.  Let him know I have attempted to call him back.    Also referring to the call Pt's son, Ava Deguire (not on DPR) called, His face is drooping, can not remember anything.Need a sooner appt than,do not know if he has had another stroke.  Pt's son stated, do not need to take him to the ED. Would like a call from the nurse      Documentation    Alvaro Aungst [phone]  Thana Farr B 4 hours ago (8:37 AM)

## 2021-02-26 NOTE — Telephone Encounter (Signed)
Pt's son, Rhyatt Muska (not on DPR) called, His face is drooping, can not remember anything.Need a sooner appt than,do not know if he has had another stroke.  Pt's son stated, do not need to take him to the ED. Would like a call from the nurse

## 2021-02-26 NOTE — Telephone Encounter (Signed)
Please see other note for documentation.

## 2021-03-01 NOTE — Telephone Encounter (Signed)
[name] [id number].  Informed he can bring patient in for labs. Ordered BUN and Creatine.  Patient denied further questions, verbalized understanding and expressed appreciation for the phone call.

## 2021-03-01 NOTE — Addendum Note (Signed)
Addended by: Alexia Freestone R on: 03/01/2021 02:11 PM   Modules accepted: Orders

## 2021-03-01 NOTE — Telephone Encounter (Signed)
Patient CT is scheduled at [facility] for Wednesday 03/07/21 to arrive at 2:30 pm. I spoke with patient son Harvie Heck and he is aware of time and day. I also gave him their number of [phone] incase he needed to r/s for any reason.  [facility] also needs a recent BUN & Creatinine order put in.

## 2021-03-01 NOTE — Telephone Encounter (Signed)
Reached son [name] by phone.  Stated they are working on getting him in assisted living.   She stated that patient is blind and deaf now and his mother stated she did not love Forensic scientist) anymore and will not help him nor be his nurse.  Son is asking for signature/assistance on getting him in [place].    Stated the facial drooping is not new and has drooped since he fell and broke his ribs last year.    Sent message to scheduler regarding CT that has been ordered since December,  Son will help.  Do not call wife for any assistance as she will just be a dead end.

## 2021-03-01 NOTE — Telephone Encounter (Signed)
Returned call, unable to leave message. 

## 2021-03-01 NOTE — Telephone Encounter (Signed)
thanks

## 2021-03-02 ENCOUNTER — Other Ambulatory Visit (INDEPENDENT_AMBULATORY_CARE_PROVIDER_SITE_OTHER): Payer: Self-pay

## 2021-03-02 DIAGNOSIS — Z0289 Encounter for other administrative examinations: Secondary | ICD-10-CM

## 2021-03-02 DIAGNOSIS — I639 Cerebral infarction, unspecified: Secondary | ICD-10-CM

## 2021-03-03 LAB — BUN: BUN: 15 mg/dL (ref 8–27)

## 2021-03-03 LAB — CREATININE, SERUM
Creatinine, Ser: 0.78 mg/dL (ref 0.76–1.27)
eGFR: 87 mL/min/{1.73_m2} (ref >59)

## 2021-03-06 NOTE — Progress Notes (Signed)
Kindly inform the patient that lab work for kidney function was satisfactory

## 2021-03-07 ENCOUNTER — Ambulatory Visit (HOSPITAL_COMMUNITY)
Admission: RE | Admit: 2021-03-07 | Discharge: 2021-03-07 | Disposition: A | Discharge status: Home / Self Care | Payer: Medicare Other | Source: Ambulatory Visit | Attending: Neurology | Admitting: Neurology

## 2021-03-07 ENCOUNTER — Other Ambulatory Visit: Payer: Self-pay

## 2021-03-07 DIAGNOSIS — I639 Cerebral infarction, unspecified: Secondary | ICD-10-CM | POA: Insufficient documentation

## 2021-03-07 MED ORDER — IOHEXOL 300 MG/ML  SOLN
75.0000 mL | Freq: Once | INTRAMUSCULAR | Status: AC | PRN
  Administered 2021-03-07: 75 mL via INTRAVENOUS

## 2021-03-08 ENCOUNTER — Telehealth: Payer: Self-pay | Admitting: Emergency Medicine

## 2021-03-08 NOTE — Progress Notes (Signed)
Kindly inform the patient that CT scan study of the brain with and without dye injection shows mild age-related changes of hardening of the arteries and shrinkage of the brain.  No new or worrisome finding

## 2021-03-08 NOTE — Telephone Encounter (Signed)
-----   Message from Micki Riley, MD sent at 03/06/2021  8:56 AM EST ----- Kindly inform the patient that lab work for kidney function was satisfactory

## 2021-03-08 NOTE — Telephone Encounter (Signed)
Left message on patient's VM regarding results.

## 2021-03-13 ENCOUNTER — Telehealth: Payer: Self-pay

## 2021-03-13 NOTE — Telephone Encounter (Signed)
I called patient.  I spoke to patient and patient's wife, Lura Em, per DPR.  I discussed patient's CT results.  I reminded patient and patient's wife of his appointment on April 04, 2021 with Dr. Pearlean Brownie.  Patient verbalized understanding of results.  Patient had no questions or concerns.

## 2021-03-13 NOTE — Telephone Encounter (Signed)
-----   Message from Micki Riley, MD sent at 03/08/2021  5:00 PM EST ----- Kindly inform the patient that CT scan study of the brain with and without dye injection shows mild age-related changes of hardening of the arteries and shrinkage of the brain.  No new or worrisome finding

## 2021-04-04 ENCOUNTER — Ambulatory Visit: Payer: Non-veteran care | Admitting: Neurology

## 2021-05-04 ENCOUNTER — Ambulatory Visit (INDEPENDENT_AMBULATORY_CARE_PROVIDER_SITE_OTHER): Payer: Medicare Other | Admitting: Internal Medicine

## 2021-05-04 ENCOUNTER — Encounter: Payer: Self-pay | Admitting: Internal Medicine

## 2021-05-04 VITALS — BP 148/70 | HR 60 | Ht 72.0 in | Wt 184.0 lb

## 2021-05-04 DIAGNOSIS — I350 Nonrheumatic aortic (valve) stenosis: Secondary | ICD-10-CM | POA: Diagnosis not present

## 2021-05-04 DIAGNOSIS — I10 Essential (primary) hypertension: Secondary | ICD-10-CM

## 2021-05-04 DIAGNOSIS — I442 Atrioventricular block, complete: Secondary | ICD-10-CM | POA: Diagnosis not present

## 2021-05-04 NOTE — Patient Instructions (Signed)
Medication Instructions:  Continue all current medications.  Labwork: none  Testing/Procedures: none  Follow-Up: 1 year - [facility]   Any Other Special Instructions Will Be Listed Below (If Applicable).  If you need a refill on your cardiac medications before your next appointment, please call your pharmacy.  

## 2021-05-04 NOTE — Addendum Note (Signed)
Addended by: Leonides Schanz C on: 05/04/2021 03:29 PM   Modules accepted: Orders

## 2021-05-04 NOTE — Progress Notes (Signed)
PCP: Jason Coop, FNP   Primary EP:  Dr Johney Frame  [name] is a 87 y.o. male who presents today for routine electrophysiology followup.  Since last being seen in our clinic, the patient reports doing reasonably well.  He has declined since last visit.  He has lost his vision and hearing per his son (care giver with  Him today).  He had several strokes this past year.  He is following with Dr Pearlean Brownie.   Today, he denies symptoms of palpitations, chest pain, shortness of breath,  lower extremity edema, dizziness, presyncope, or syncope.  The patient is otherwise without complaint today.   Past Medical History:  Diagnosis Date  . Aortic stenosis    mild  . Broken ribs   . Complete heart block (HCC)    s/p PPM  . Diabetes mellitus   . HL (hearing loss)   . HTN (hypertension)   . Malaise and fatigue   . Stroke [facility])    Neuro - 3x   Past Surgical History:  Procedure Laterality Date  . EP IMPLANTABLE DEVICE N/A 11/14/2015   Procedure: PPM Generator Changeout;  Surgeon: Hillis Range, MD; Medtronic Adapta L  . HEMORRHOID SURGERY    . medtronic senia  12/04/07   PPM implantaed by GT    ROS- all systems are reviewed and negative except as per HPI above  Current Outpatient Medications  Medication Sig Dispense Refill  . amLODipine (NORVASC) 10 MG tablet Take 10 mg by mouth daily.    . Cholecalciferol (VITAMIN D3) 5000 UNITS CAPS Take by mouth daily.    . clopidogrel (PLAVIX) 75 MG tablet Take 1 tablet (75 mg total) by mouth daily. 30 tablet 11  . Cyanocobalamin (VITAMIN B-12 PO) Take by mouth daily.    Marland Kitchen doxazosin (CARDURA) 4 MG tablet Take 4 mg by mouth daily.    Marland Kitchen levothyroxine (SYNTHROID) 75 MCG tablet Take 75 mcg by mouth daily before breakfast.     . losartan (COZAAR) 100 MG tablet Take 100 mg by mouth daily.    . metFORMIN (GLUMETZA) 1000 MG (MOD) 24 hr tablet Take 1,000 mg by mouth 2 (two) times daily.    . Multiple Vitamins-Minerals (MULTIVITAL) tablet Take 1  tablet by mouth daily.    . Multiple Vitamins-Minerals (PRESERVISION AREDS) CAPS Take 1 capsule by mouth 2 (two) times daily.    . pantoprazole (PROTONIX) 40 MG tablet Take 40 mg by mouth daily.    . simvastatin (ZOCOR) 20 MG tablet Take 20 mg by mouth daily.    . tamsulosin (FLOMAX) 0.4 MG CAPS capsule Take 0.4 mg by mouth.    . travoprost, benzalkonium, (TRAVATAN Z) 0.004 % ophthalmic solution Place 1 drop into both eyes at bedtime.     No current facility-administered medications for this visit.    Physical Exam: Vitals:   05/04/21 1052  BP: (!) 148/70  Pulse: 60  SpO2: 99%  Weight: 184 lb (83.5 kg)  Height: 6' (1.829 m)    GEN- The patient is well appearing, alert and oriented x 3 today.   Head- normocephalic, atraumatic Eyes-  Sclera clear, conjunctiva pink Ears- hearing intact Oropharynx- clear Lungs- Clear to ausculation bilaterally, normal work of breathing Chest- pacemaker pocket is well healed Heart- Regular rate and rhythm, 2/6 SEM LUSB which is mid to late peaking GI- soft, NT, ND, + BS Extremities- no clubbing, cyanosis, or edema  Pacemaker interrogation- reviewed in detail today,  See PACEART report  Assessment and Plan:  1. Symptomatic complete heart block Normal pacemaker function See Pace Art report No changes today he is device dependant today  2. HTN Stable No change required today  3. Moderate AS Stable No change required today Echo 06/13/20 reviewed, moderate AS again noted  Risks, benefits and potential toxicities for medications prescribed and/or refilled reviewed with patient today.   Return in a year  Hillis Range MD, [facility] 05/04/2021 11:34 AM

## 2021-07-04 ENCOUNTER — Ambulatory Visit: Payer: Medicare Other | Attending: Critical Care Medicine

## 2021-07-04 DIAGNOSIS — Z23 Encounter for immunization: Secondary | ICD-10-CM

## 2021-07-05 NOTE — Progress Notes (Signed)
   Covid-19 Vaccination Clinic  Name:  [name]    MRN: [id number] DOB: 25-May-1933  07/05/2021  [name] was observed post Covid-19 immunization for 15 minutes without incident. He was provided with Vaccine Information Sheet and instruction to access the V-Safe system.   [name] was instructed to call 911 with any severe reactions post vaccine: Difficulty breathing  Swelling of face and throat  A fast heartbeat  A bad rash all over body  Dizziness and weakness   Immunizations Administered     Name Date Dose VIS Date Route   PFIZER Comrnaty(Gray TOP) Covid-19 Vaccine 07/04/2021  1:48 PM 0.3 mL 12/07/2020 Intramuscular   Manufacturer: [employer], [employer]   Lot: [id number]   NDC: [phone]

## 2021-07-12 ENCOUNTER — Ambulatory Visit: Payer: Non-veteran care | Admitting: Neurology

## 2021-07-20 ENCOUNTER — Ambulatory Visit (INDEPENDENT_AMBULATORY_CARE_PROVIDER_SITE_OTHER): Payer: Medicare Other

## 2021-07-20 DIAGNOSIS — I442 Atrioventricular block, complete: Secondary | ICD-10-CM

## 2021-07-23 LAB — CUP PACEART REMOTE DEVICE CHECK
Battery Impedance: 426 Ohm
Battery Remaining Longevity: 79 mo
Battery Voltage: 2.79 V
Brady Statistic AP VP Percent: 41 %
Brady Statistic AP VS Percent: 0 %
Brady Statistic AS VP Percent: 59 %
Brady Statistic AS VS Percent: 0 %
Date Time Interrogation Session: 20220722080219
Implantable Lead Implant Date: 20081205
Implantable Lead Implant Date: 20081205
Implantable Lead Location: 753859
Implantable Lead Location: 753860
Implantable Lead Model: 5076
Implantable Lead Model: 5076
Implantable Pulse Generator Implant Date: 20161115
Lead Channel Impedance Value: 428 Ohm
Lead Channel Impedance Value: 455 Ohm
Lead Channel Pacing Threshold Amplitude: 1 V
Lead Channel Pacing Threshold Amplitude: 1.25 V
Lead Channel Pacing Threshold Pulse Width: 0.4 ms
Lead Channel Pacing Threshold Pulse Width: 0.4 ms
Lead Channel Setting Pacing Amplitude: 2 V
Lead Channel Setting Pacing Amplitude: 2.75 V
Lead Channel Setting Pacing Pulse Width: 0.4 ms
Lead Channel Setting Sensing Sensitivity: 4 mV

## 2021-08-09 NOTE — Progress Notes (Signed)
Remote pacemaker transmission.   

## 2021-11-07 ENCOUNTER — Ambulatory Visit (INDEPENDENT_AMBULATORY_CARE_PROVIDER_SITE_OTHER): Payer: Medicare Other

## 2021-11-07 DIAGNOSIS — I442 Atrioventricular block, complete: Secondary | ICD-10-CM | POA: Diagnosis not present

## 2021-11-08 LAB — CUP PACEART REMOTE DEVICE CHECK
Battery Impedance: 525 Ohm
Battery Remaining Longevity: 71 mo
Battery Voltage: 2.78 V
Brady Statistic AP VP Percent: 46 %
Brady Statistic AP VS Percent: 0 %
Brady Statistic AS VP Percent: 54 %
Brady Statistic AS VS Percent: 0 %
Date Time Interrogation Session: 20221108194138
Implantable Lead Implant Date: 20081205
Implantable Lead Implant Date: 20081205
Implantable Lead Location: 753859
Implantable Lead Location: 753860
Implantable Lead Model: 5076
Implantable Lead Model: 5076
Implantable Pulse Generator Implant Date: 20161115
Lead Channel Impedance Value: 406 Ohm
Lead Channel Impedance Value: 482 Ohm
Lead Channel Pacing Threshold Amplitude: 1 V
Lead Channel Pacing Threshold Amplitude: 1.375 V
Lead Channel Pacing Threshold Pulse Width: 0.4 ms
Lead Channel Pacing Threshold Pulse Width: 0.4 ms
Lead Channel Setting Pacing Amplitude: 2 V
Lead Channel Setting Pacing Amplitude: 3 V
Lead Channel Setting Pacing Pulse Width: 0.4 ms
Lead Channel Setting Sensing Sensitivity: 4 mV

## 2021-11-15 NOTE — Progress Notes (Signed)
Remote pacemaker transmission.   

## 2022-02-06 ENCOUNTER — Ambulatory Visit (INDEPENDENT_AMBULATORY_CARE_PROVIDER_SITE_OTHER): Payer: Medicare Other

## 2022-02-06 DIAGNOSIS — I442 Atrioventricular block, complete: Secondary | ICD-10-CM

## 2022-02-07 LAB — CUP PACEART REMOTE DEVICE CHECK
Battery Impedance: 575 Ohm
Battery Remaining Longevity: 68 mo
Battery Voltage: 2.78 V
Brady Statistic AP VP Percent: 44 %
Brady Statistic AP VS Percent: 0 %
Brady Statistic AS VP Percent: 56 %
Brady Statistic AS VS Percent: 0 %
Date Time Interrogation Session: 20230209094635
Implantable Lead Implant Date: 20081205
Implantable Lead Implant Date: 20081205
Implantable Lead Location: 753859
Implantable Lead Location: 753860
Implantable Lead Model: 5076
Implantable Lead Model: 5076
Implantable Pulse Generator Implant Date: 20161115
Lead Channel Impedance Value: 406 Ohm
Lead Channel Impedance Value: 443 Ohm
Lead Channel Pacing Threshold Amplitude: 0.875 V
Lead Channel Pacing Threshold Amplitude: 1.25 V
Lead Channel Pacing Threshold Pulse Width: 0.4 ms
Lead Channel Pacing Threshold Pulse Width: 0.4 ms
Lead Channel Setting Pacing Amplitude: 2 V
Lead Channel Setting Pacing Amplitude: 2.75 V
Lead Channel Setting Pacing Pulse Width: 0.4 ms
Lead Channel Setting Sensing Sensitivity: 4 mV

## 2022-02-11 NOTE — Progress Notes (Signed)
Remote pacemaker transmission.   

## 2022-05-03 ENCOUNTER — Encounter: Payer: Medicare Other | Admitting: Internal Medicine

## 2022-05-08 ENCOUNTER — Ambulatory Visit (INDEPENDENT_AMBULATORY_CARE_PROVIDER_SITE_OTHER): Payer: Medicare Other

## 2022-05-08 DIAGNOSIS — I442 Atrioventricular block, complete: Secondary | ICD-10-CM

## 2022-05-09 LAB — CUP PACEART REMOTE DEVICE CHECK
Battery Impedance: 626 Ohm
Battery Remaining Longevity: 67 mo
Battery Voltage: 2.79 V
Brady Statistic AP VP Percent: 42 %
Brady Statistic AP VS Percent: 0 %
Brady Statistic AS VP Percent: 58 %
Brady Statistic AS VS Percent: 0 %
Date Time Interrogation Session: 20230511100429
Implantable Lead Implant Date: 20081205
Implantable Lead Implant Date: 20081205
Implantable Lead Location: 753859
Implantable Lead Location: 753860
Implantable Lead Model: 5076
Implantable Lead Model: 5076
Implantable Pulse Generator Implant Date: 20161115
Lead Channel Impedance Value: 417 Ohm
Lead Channel Impedance Value: 505 Ohm
Lead Channel Pacing Threshold Amplitude: 1 V
Lead Channel Pacing Threshold Amplitude: 1.375 V
Lead Channel Pacing Threshold Pulse Width: 0.4 ms
Lead Channel Pacing Threshold Pulse Width: 0.4 ms
Lead Channel Setting Pacing Amplitude: 2.25 V
Lead Channel Setting Pacing Amplitude: 2.75 V
Lead Channel Setting Pacing Pulse Width: 0.4 ms
Lead Channel Setting Sensing Sensitivity: 4 mV

## 2022-05-10 ENCOUNTER — Encounter: Payer: Self-pay | Admitting: Internal Medicine

## 2022-05-10 ENCOUNTER — Ambulatory Visit (INDEPENDENT_AMBULATORY_CARE_PROVIDER_SITE_OTHER): Payer: Medicare Other | Admitting: Internal Medicine

## 2022-05-10 VITALS — BP 110/62 | HR 63 | Ht 72.0 in | Wt 183.4 lb

## 2022-05-10 DIAGNOSIS — I442 Atrioventricular block, complete: Secondary | ICD-10-CM | POA: Diagnosis not present

## 2022-05-10 DIAGNOSIS — I10 Essential (primary) hypertension: Secondary | ICD-10-CM | POA: Diagnosis not present

## 2022-05-10 NOTE — Progress Notes (Signed)
? ? ?PCP: Wannetta Sender, FNP ?  ?Primary EP:  Dr Rayann Heman ? ?[name] is a 88 y.o. male who presents today for routine electrophysiology followup.  Since last being seen in our clinic, the patient reports doing very well.  Today, he denies symptoms of palpitations, chest pain, shortness of breath,  lower extremity edema, dizziness, presyncope, or syncope.  The patient is otherwise without complaint today.  ? ?Past Medical History:  ?Diagnosis Date  ? Aortic stenosis   ? mild  ? Broken ribs   ? Complete heart block (HCC)   ? s/p PPM  ? Diabetes mellitus   ? HL (hearing loss)   ? HTN (hypertension)   ? Malaise and fatigue   ? Stroke [facility])   ? Neuro - 3x  ? ?Past Surgical History:  ?Procedure Laterality Date  ? EP IMPLANTABLE DEVICE N/A 11/14/2015  ? Procedure: PPM Generator Changeout;  Surgeon: Thompson Grayer, MD; Medtronic Adapta L  ? HEMORRHOID SURGERY    ? medtronic senia  12/04/07  ? PPM implantaed by [facility]  ? ? ?ROS- all systems are reviewed and negative except as per HPI above ? ?Current Outpatient Medications  ?Medication Sig Dispense Refill  ? amLODipine (NORVASC) 10 MG tablet Take 10 mg by mouth daily.    ? atorvastatin (LIPITOR) 40 MG tablet Take 40 mg by mouth daily.    ? Cholecalciferol (VITAMIN D3) 5000 UNITS CAPS Take by mouth daily.    ? clopidogrel (PLAVIX) 75 MG tablet Take 1 tablet (75 mg total) by mouth daily. 30 tablet 11  ? Cyanocobalamin (VITAMIN B-12 PO) Take by mouth daily.    ? ferrous sulfate 325 (65 FE) MG tablet Take 325 mg by mouth daily with breakfast.    ? finasteride (PROSCAR) 5 MG tablet Take 5 mg by mouth daily.    ? latanoprost (XALATAN) 0.005 % ophthalmic solution 1 drop at bedtime.    ? levothyroxine (SYNTHROID) 75 MCG tablet Take 75 mcg by mouth daily before breakfast.     ? losartan (COZAAR) 100 MG tablet Take 100 mg by mouth daily.    ? metFORMIN (GLUMETZA) 1000 MG (MOD) 24 hr tablet Take 1,000 mg by mouth 2 (two) times daily.    ? Multiple Vitamins-Minerals  (MULTIVITAL) tablet Take 1 tablet by mouth daily.    ? Multiple Vitamins-Minerals (PRESERVISION AREDS) CAPS Take 1 capsule by mouth 2 (two) times daily.    ? tamsulosin (FLOMAX) 0.4 MG CAPS capsule Take 0.4 mg by mouth.    ? doxazosin (CARDURA) 4 MG tablet Take 4 mg by mouth daily. (Patient not taking: Reported on 05/10/2022)    ? pantoprazole (PROTONIX) 40 MG tablet Take 40 mg by mouth daily. (Patient not taking: Reported on 05/10/2022)    ? travoprost, benzalkonium, (TRAVATAN Z) 0.004 % ophthalmic solution Place 1 drop into both eyes at bedtime. (Patient not taking: Reported on 05/10/2022)    ? ?No current facility-administered medications for this visit.  ? ? ?Physical Exam: ?Vitals:  ? 05/10/22 1115  ?BP: 110/62  ?Pulse: 63  ?SpO2: 97%  ?Weight: 183 lb 6.4 oz (83.2 kg)  ?Height: 6' (1.829 m)  ? ? ?GEN- The patient is well appearing, alert and oriented x 3 today.   ?Head- normocephalic, atraumatic ?Eyes-  Sclera clear, conjunctiva pink ?Ears- hearing intact ?Oropharynx- clear ?Lungs- Clear to ausculation bilaterally, normal work of breathing ?Chest- pacemaker pocket is well healed ?Heart- Regular rate and rhythm, 2/6 SEM LUSB ?GI- soft, NT,  ND, + BS ?Extremities- no clubbing, cyanosis, or edema ? ?Pacemaker interrogation- reviewed in detail today,  See PACEART report ? ?ekg tracing ordered today is personally reviewed and shows sinus with V pacing ? ?Assessment and Plan: ? ?1. Symptomatic  complete heart block ?Normal pacemaker function ?See Claudia Desanctis Art report ?No changes today ?he is device dependant today ? ?2. HTN ?Stable ?No change required today ? ?3. Moderate AS ?Clinically stable ? ?Return in a year ? ?Thompson Grayer MD, FACC ?05/10/2022 ?11:28 AM ? ? ?

## 2022-05-10 NOTE — Patient Instructions (Addendum)
Medication Instructions:  Your physician recommends that you continue on your current medications as directed. Please refer to the Current Medication list given to you today.  Labwork: none  Testing/Procedures: none  Follow-Up: Your physician recommends that you schedule a follow-up appointment in: 1 year.   Any Other Special Instructions Will Be Listed Below (If Applicable).  If you need a refill on your cardiac medications before your next appointment, please call your pharmacy. 

## 2022-05-17 NOTE — Progress Notes (Signed)
Remote pacemaker transmission.   

## 2022-08-07 ENCOUNTER — Ambulatory Visit (INDEPENDENT_AMBULATORY_CARE_PROVIDER_SITE_OTHER): Payer: Medicare Other

## 2022-08-07 DIAGNOSIS — I442 Atrioventricular block, complete: Secondary | ICD-10-CM | POA: Diagnosis not present

## 2022-08-08 LAB — CUP PACEART REMOTE DEVICE CHECK
Battery Impedance: 726 Ohm
Battery Remaining Longevity: 62 mo
Battery Voltage: 2.77 V
Brady Statistic AP VP Percent: 41 %
Brady Statistic AP VS Percent: 0 %
Brady Statistic AS VP Percent: 59 %
Brady Statistic AS VS Percent: 0 %
Date Time Interrogation Session: 20230810131149
Implantable Lead Implant Date: 20081205
Implantable Lead Implant Date: 20081205
Implantable Lead Location: 753859
Implantable Lead Location: 753860
Implantable Lead Model: 5076
Implantable Lead Model: 5076
Implantable Pulse Generator Implant Date: 20161115
Lead Channel Impedance Value: 418 Ohm
Lead Channel Impedance Value: 496 Ohm
Lead Channel Pacing Threshold Amplitude: 1 V
Lead Channel Pacing Threshold Amplitude: 1.5 V
Lead Channel Pacing Threshold Pulse Width: 0.4 ms
Lead Channel Pacing Threshold Pulse Width: 0.4 ms
Lead Channel Setting Pacing Amplitude: 2 V
Lead Channel Setting Pacing Amplitude: 3 V
Lead Channel Setting Pacing Pulse Width: 0.4 ms
Lead Channel Setting Sensing Sensitivity: 4 mV

## 2022-09-10 NOTE — Progress Notes (Signed)
Remote pacemaker transmission.   

## 2022-10-28 ENCOUNTER — Ambulatory Visit (INDEPENDENT_AMBULATORY_CARE_PROVIDER_SITE_OTHER): Payer: Medicare Other | Admitting: Physician Assistant

## 2022-10-28 ENCOUNTER — Encounter: Payer: Self-pay | Admitting: Physician Assistant

## 2022-10-28 VITALS — BP 121/73 | HR 80 | Wt 185.0 lb

## 2022-10-28 DIAGNOSIS — R339 Retention of urine, unspecified: Secondary | ICD-10-CM | POA: Diagnosis not present

## 2022-10-28 DIAGNOSIS — R3911 Hesitancy of micturition: Secondary | ICD-10-CM

## 2022-10-28 DIAGNOSIS — N410 Acute prostatitis: Secondary | ICD-10-CM

## 2022-10-28 LAB — BLADDER SCAN AMB NON-IMAGING: Scan Result: 0

## 2022-10-28 MED ORDER — DOXYCYCLINE HYCLATE 100 MG PO CAPS: 100.0000 mg | ORAL_CAPSULE | Freq: Two times a day (BID) | ORAL | 0 refills | Status: DC

## 2022-10-28 NOTE — Progress Notes (Signed)
post void residual=0 ?

## 2022-10-28 NOTE — Progress Notes (Signed)
10/28/2022 8:53 AM   [name] 30-Jul-1933 [id number]   Assessment:  1. Urinary hesitancy - Urinalysis, Routine w reflex microscopic - BLADDER SCAN AMB NON-IMAGING  2. Acute prostatitis    Plan: Discussed prostatitis and antibx tx at length with the pt. orders are written for the patient's facility to obtain a clean specimen urine and send it off for urinalysis with microscopic exam with reflex culture.  Patient is made aware of the concern of the gross appearance of his urine today.  Less than 2 mm of urine was obtained in the urine cup.  Orders were placed for doxycycline 100 mg twice daily for 3 weeks.  We will continue finasteride and tamsulosin.  Follow-up in 1 month for urinalysis and PVR.  Meds ordered this encounter  Medications   DISCONTD: doxycycline (VIBRAMYCIN) 100 MG capsule    Sig: Take 1 capsule (100 mg total) by mouth every 12 (twelve) hours.    Dispense:  42 capsule    Refill:  0   doxycycline (VIBRAMYCIN) 100 MG capsule    Sig: Take 1 capsule (100 mg total) by mouth every 12 (twelve) hours.    Dispense:  42 capsule    Refill:  0      Chief Complaint: difficulty voiding  Referring provider: Wannetta Sender, FNP [facility] [address] [place],  [place] [address]   History of Present Illness:  [name] is a 88 y.o. year old male residing at [facility] in [place] who is seen in consultation from [place], [place], FNP  for evaluation of urinary hesitancy, intermittency, and nocturia 7-8x/night for the past 3 weeks. Pt states he spent an entire day without being able to void approximately 3 weeks ago and recalls that he had a large BM and was able to void after.  Since that time he continues to have hesitancy, intermittency, urgency.  He denies dysuria, burning, gross hematuria.  No significant increase in daytime frequency.  He denies history of urinary stones and reports no  history of UTIs.  The patient admits to drinking "a quart" of water before bed every night.  Patient denies constipation or hard stools. He has 1 decaf coffee in the morning and occasional tea throughout the day.  Medications reviewed from the facility indicate the patient is on finasteride 5 mg daily as well as tamsulosin 0.4 mg daily.  There are no medical records within the epic system or care everywhere indicating urinary complaints in the past. Patient's history is otherwise significant for pacemaker placement for complete heart block history, diabetes mellitus, hypertension, history of stroke x3.  The patient is unable to explain why he is in assisted living.  He reports his wife still lives in their home.  He is accompanied by [facility] facilities transportation driver.  UA= the patient is unable to give a specimen today as he states he does not feel he needs to void.  He admits to emptying his bladder just before arrival. Approx 1- 11ml on urine noted in specimen cup appears brownish gray and opaque to gross exam PVR=36ml IPSS=15, QOL=3  Medical records including notes, lab results, and imaging studies reviewed during pt OV.  Past Medical History:  Past Medical History:  Diagnosis Date   Aortic stenosis    mild   Broken ribs    Complete heart block (HCC)    s/p PPM   Diabetes mellitus    HL (  hearing loss)    HTN (hypertension)    Malaise and fatigue    Stroke [facility])    Neuro - 3x    Past Surgical History:  Past Surgical History:  Procedure Laterality Date   EP IMPLANTABLE DEVICE N/A 11/14/2015   Procedure: PPM Generator Changeout;  Surgeon: Hillis Range, MD; [employer] Adapta L   HEMORRHOID SURGERY     medtronic senia  12/04/07   PPM implantaed by [facility]    Allergies:  Allergies  Allergen Reactions   Aspirin     Upset stomach with full strength   Codeine     REACTION: syncope    Family History:  Family History  Problem Relation Age of Onset   Hypertension Other      Social History:  Social History   Tobacco Use   Smoking status: Former    Types: Cigarettes    Quit date: 02/20/1982    Years since quitting: 40.7   Smokeless tobacco: Never  Vaping Use   Vaping Use: Never used  Substance Use Topics   Alcohol use: No    Alcohol/week: 0.0 standard drinks of alcohol    Review of symptoms:  Constitutional:  Negative for unexplained weight loss, night sweats, fever, chills ENT:  Negative for nose bleeds, sinus pain, painful swallowing CV:  Negative for chest pain Resp:  Negative for cough, wheezing, shortness of breath GI:  Negative for nausea, vomiting GU:  Positives noted in HPI Neuro:  Negative for seizures, slurred speech Psych:  Negative for lack of energy, depression, anxiety Endocrine:  Negative for polydipsia, polyuria, symptoms of hypoglycemia (dizziness, hunger, sweating) Hematologic:  Negative for anemia, purpura, petechia  Physical Exam: BP 121/73   Pulse 80   Wt 185 lb (83.9 kg)   BMI 25.09 kg/m   Constitutional:  Alert and oriented, No acute distress. HEENT: NCAT, moist mucus membranes.  Trachea midline, no masses. Cardiovascular: Regular rate and rhythm with harsh holocystolic murmur, no rub, or gallops Respiratory: Normal respiratory effort, clear to auscultation bilaterally GI: Abdomen is soft, nontender, nondistended, no abdominal masses GU: Stool around exterior of anus and stool noted in rectal vault.  Prostate is tender, nodular, somewhat indurated and enlarged.  Clinical RN, Alfredo Martinez, present during exam as chaperone BACK:  Non-tender to palpation.  No CVAT Skin: No obvious rashes, warm, dry, intact Neurologic: Alert and oriented, a bit unsteady when transferring from table to chair, but ambulates without difficulty using walker for assistance. Psychiatric: Appropriate. Normal mood and affect.  Laboratory Data: No results found for this or any previous visit (from the past 24 hour(s)).  Lab Results  Component  Value Date   WBC 6.4 11/24/2007   HGB 13.9 11/24/2007   HCT 40.8 11/24/2007   MCV 83.2 11/24/2007   PLT 203 11/24/2007    Lab Results  Component Value Date   CREATININE 0.78 03/02/2021    No results found for: "PSA"  No results found for: "TESTOSTERONE"  Lab Results  Component Value Date   HGBA1C 6.3 (H) 12/26/2020    Urinalysis No results found for: "COLORURINE", "APPEARANCEUR", "LABSPEC", "PHURINE", "GLUCOSEU", "HGBUR", "BILIRUBINUR", "KETONESUR", "PROTEINUR", "UROBILINOGEN", "NITRITE", "LEUKOCYTESUR"  No results found for: "LABMICR", "WBCUA", "RBCUA", "LABEPIT", "MUCUS", "BACTERIA"  Pertinent Imaging: No results found for this or any previous visit.  No results found for this or any previous visit.     [place], Regan Rakers, PA-C [facility] [place]

## 2022-11-06 ENCOUNTER — Ambulatory Visit (INDEPENDENT_AMBULATORY_CARE_PROVIDER_SITE_OTHER): Payer: Medicare Other

## 2022-11-06 DIAGNOSIS — I442 Atrioventricular block, complete: Secondary | ICD-10-CM

## 2022-11-06 LAB — CUP PACEART REMOTE DEVICE CHECK
Battery Impedance: 752 Ohm
Battery Remaining Longevity: 62 mo
Battery Voltage: 2.77 V
Brady Statistic AP VP Percent: 40 %
Brady Statistic AP VS Percent: 0 %
Brady Statistic AS VP Percent: 60 %
Brady Statistic AS VS Percent: 0 %
Date Time Interrogation Session: 20231108102324
Implantable Lead Connection Status: 753985
Implantable Lead Connection Status: 753985
Implantable Lead Implant Date: 20081205
Implantable Lead Implant Date: 20081205
Implantable Lead Location: 753859
Implantable Lead Location: 753860
Implantable Lead Model: 5076
Implantable Lead Model: 5076
Implantable Pulse Generator Implant Date: 20161115
Lead Channel Impedance Value: 412 Ohm
Lead Channel Impedance Value: 491 Ohm
Lead Channel Pacing Threshold Amplitude: 0.875 V
Lead Channel Pacing Threshold Amplitude: 1.375 V
Lead Channel Pacing Threshold Pulse Width: 0.4 ms
Lead Channel Pacing Threshold Pulse Width: 0.4 ms
Lead Channel Setting Pacing Amplitude: 2 V
Lead Channel Setting Pacing Amplitude: 2.75 V
Lead Channel Setting Pacing Pulse Width: 0.4 ms
Lead Channel Setting Sensing Sensitivity: 4 mV
Zone Setting Status: 755011
Zone Setting Status: 755011

## 2022-11-15 IMAGING — CT CT HEAD WO/W CM
3 of 6 series · 16 of 47 positions shown, 19 images · IV contrast (Omni 300)
Comparison: CT head 12/19/2020

CLINICAL DATA: Stroke follow-up.  Diplopia.

EXAM:
CT HEAD WITHOUT AND WITH CONTRAST
TECHNIQUE: Contiguous axial images were obtained from the base of the skull
through the vertex without and with intravenous contrast
CONTRAST:  75mL OMNIPAQUE IOHEXOL 300 MG/ML  SOLN

[Series 3: head without · axial · non-contrast · 0.51mm/px · z∈[-118,+27]mm · 10 of 35 slices shown, 13 images]
[im 3/35  brain]
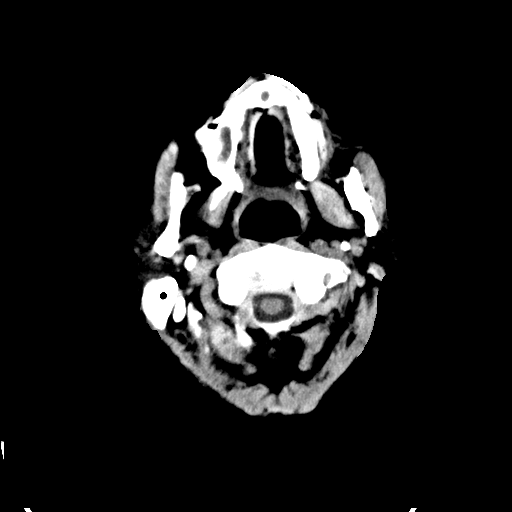
[im 3/35  bone]
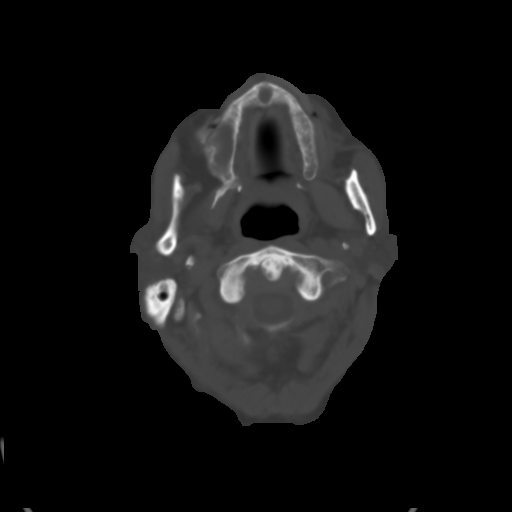
[im 5/35  brain]
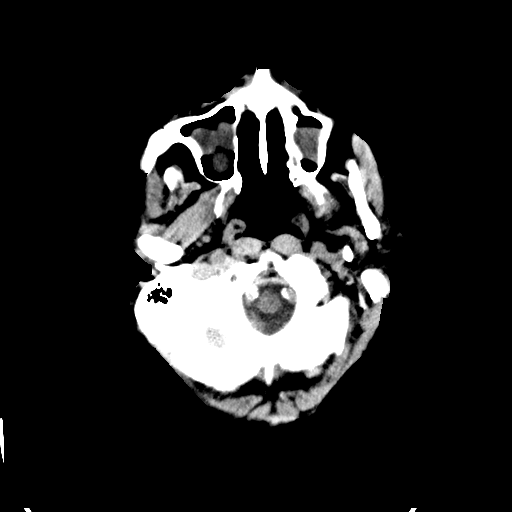
[im 10/35  brain]
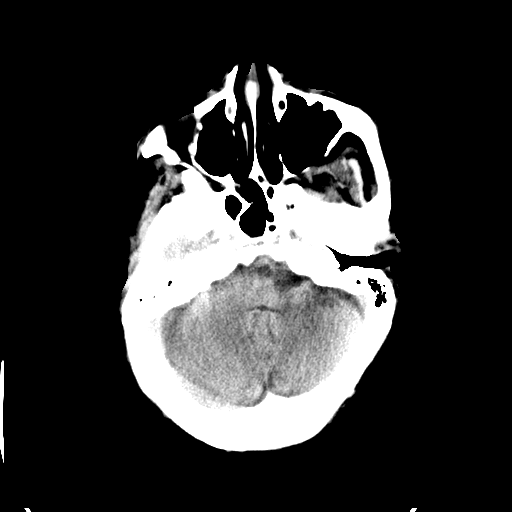
[im 13/35  brain]
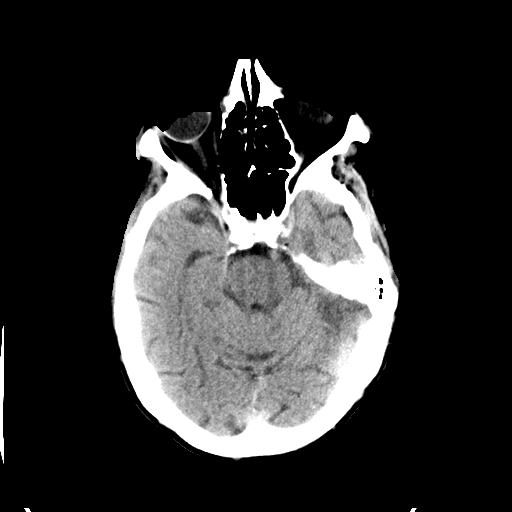
[im 15/35  brain]
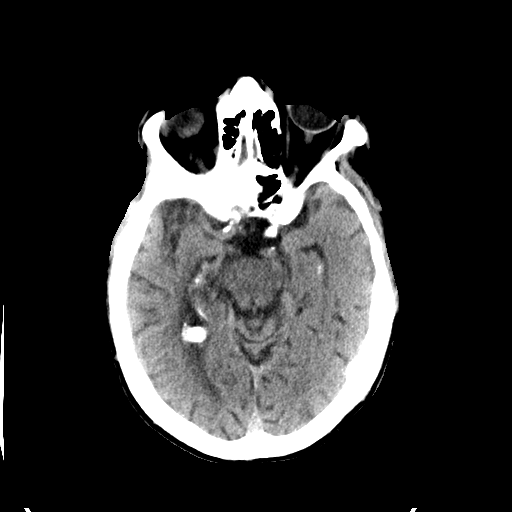
[im 15/35  bone]
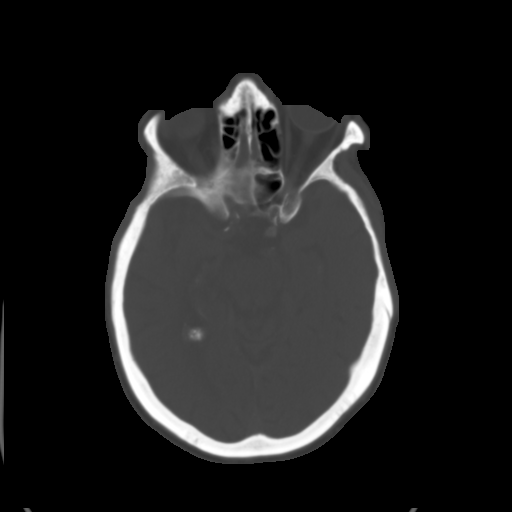
[im 20/35  brain]
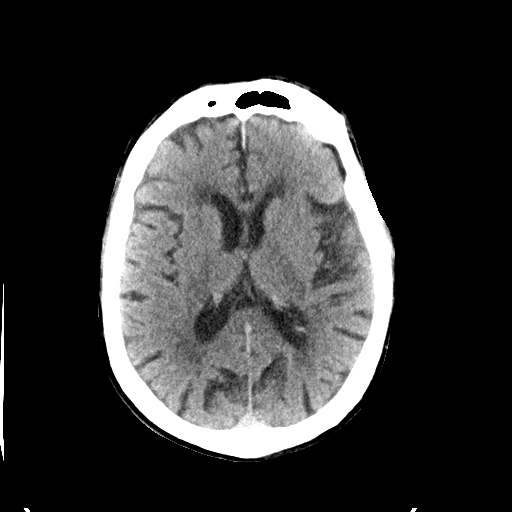
[im 22/35  brain]
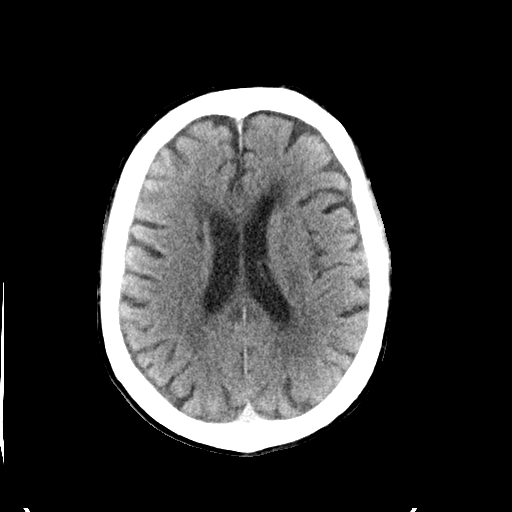
[im 25/35  brain]
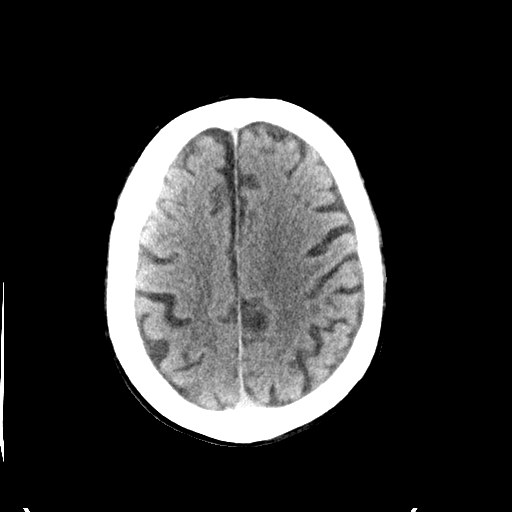
[im 30/35  brain]
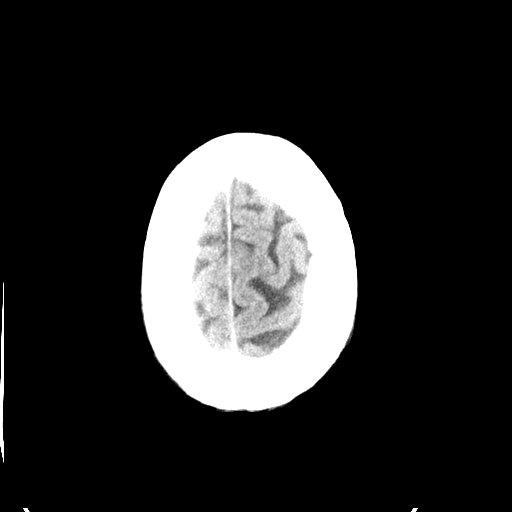
[im 30/35  bone]
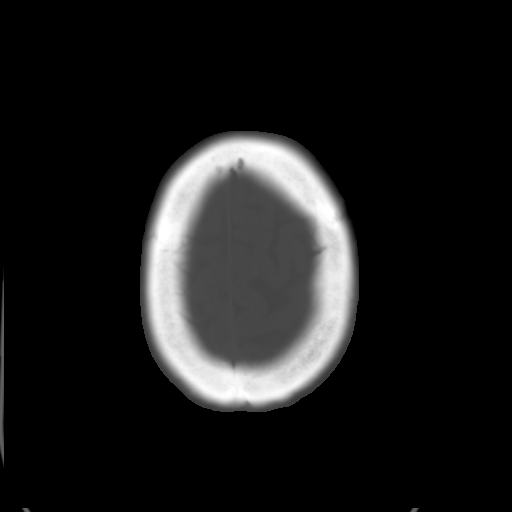
[im 32/35  brain]
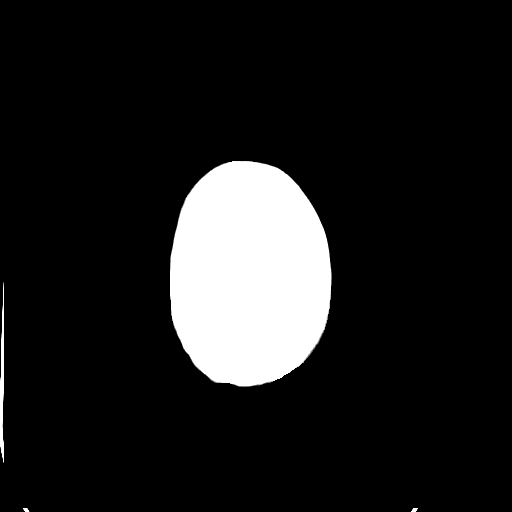

[Series 5: coronal · coronal · 0.35mm/px · 3 of 74 slices shown]
[im 15/74  brain]
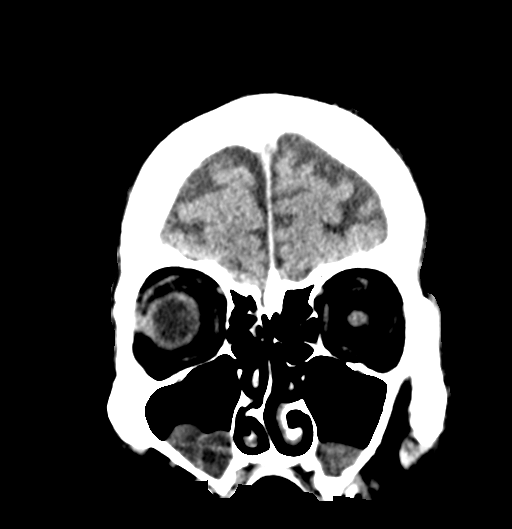
[im 30/74  brain]
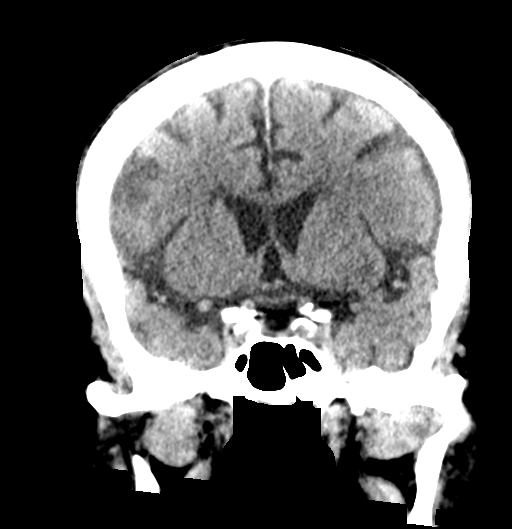
[im 44/74  brain]
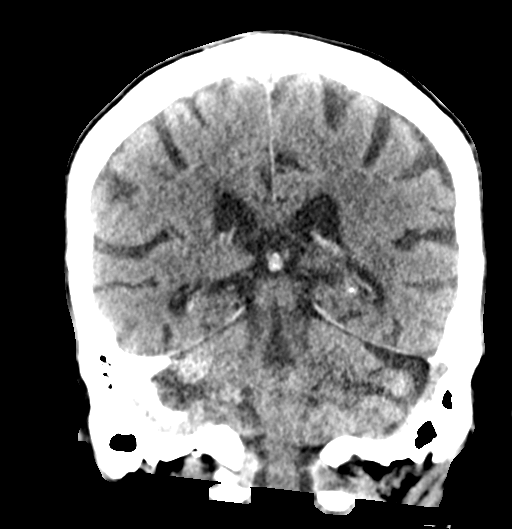

[Series 6: sagittal · sagittal · 0.36mm/px · 3 of 67 slices shown]
[im 36/67  brain]
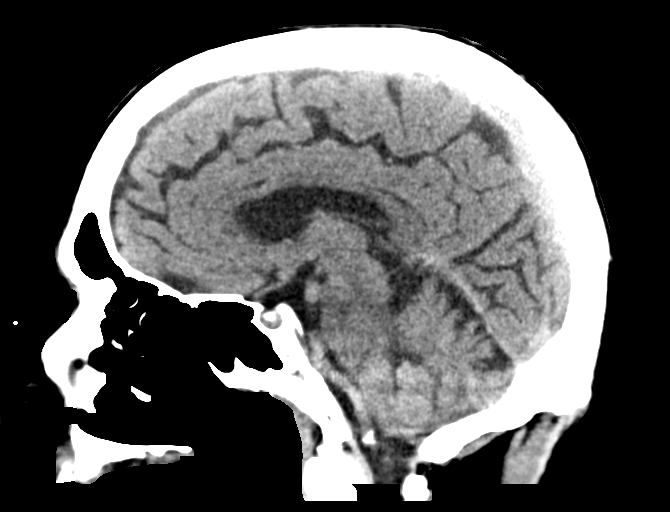
[im 45/67  brain]
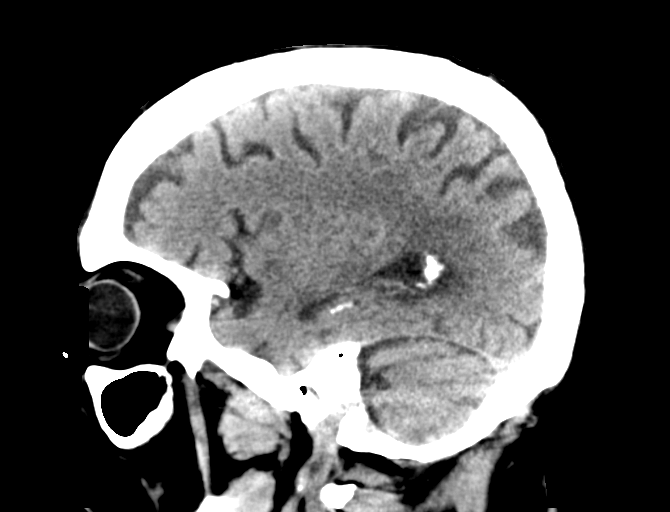
[im 54/67  brain]
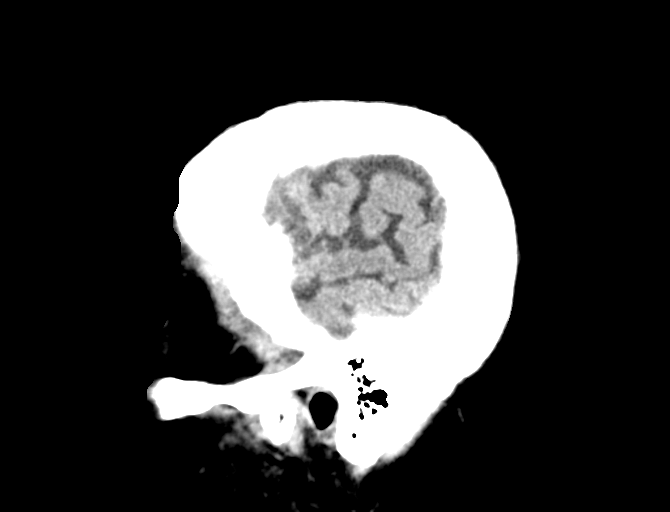

[16 of 47 positions shown; findings below may reference images not displayed]

FINDINGS: Brain: Moderate atrophy. Mild white matter hypodensity bilaterally,
chronic and unchanged. Negative for acute infarct, hemorrhage, mass.

Normal enhancement.

Vascular: Normal vascular enhancement. Negative for hyperdense
vessel. Atherosclerotic calcification in the carotid and vertebral
arteries bilaterally.

Skull: Negative

Sinuses/Orbits: Mucosal edema maxillary sinus bilaterally. Bilateral
cataract extraction

Other: None
IMPRESSION: Atrophy and chronic microvascular ischemic change. No acute
abnormality.

## 2022-11-19 NOTE — Progress Notes (Signed)
Remote pacemaker transmission.   

## 2022-11-25 ENCOUNTER — Ambulatory Visit (INDEPENDENT_AMBULATORY_CARE_PROVIDER_SITE_OTHER): Payer: Medicare Other | Admitting: Physician Assistant

## 2022-11-25 VITALS — BP 124/76 | HR 61

## 2022-11-25 DIAGNOSIS — R3911 Hesitancy of micturition: Secondary | ICD-10-CM

## 2022-11-25 DIAGNOSIS — N3943 Post-void dribbling: Secondary | ICD-10-CM | POA: Diagnosis not present

## 2022-11-25 DIAGNOSIS — N410 Acute prostatitis: Secondary | ICD-10-CM

## 2022-11-25 DIAGNOSIS — N401 Enlarged prostate with lower urinary tract symptoms: Secondary | ICD-10-CM | POA: Diagnosis not present

## 2022-11-25 LAB — BLADDER SCAN AMB NON-IMAGING: Scan Result: 39

## 2022-11-25 MED ORDER — DOXYCYCLINE HYCLATE 100 MG PO CAPS: 100.0000 mg | ORAL_CAPSULE | Freq: Two times a day (BID) | ORAL | 0 refills | Status: DC

## 2022-11-25 MED ORDER — TAMSULOSIN HCL 0.4 MG PO CAPS: 0.4000 mg | ORAL_CAPSULE | Freq: Two times a day (BID) | ORAL | 2 refills | Status: DC

## 2022-11-25 NOTE — Progress Notes (Signed)
Assessment: 1. Urinary hesitancy - Urinalysis, Routine w reflex microscopic - BLADDER SCAN AMB NON-IMAGING  2. Acute prostatitis  3. Benign prostatic hyperplasia with post-void dribbling    Plan: Orders given to facility and made and to collect urine for urinalysis and microscopic exam.  As patient's symptoms improve, but still significant will complete full 6-week therapy of doxycycline for acute prostatitis.  Increase tamsulosin to twice daily until follow-up in 6 to 8 weeks for UA PVR.  Meds ordered this encounter  Medications   doxycycline (VIBRAMYCIN) 100 MG capsule    Sig: Take 1 capsule (100 mg total) by mouth every 12 (twelve) hours.    Dispense:  42 capsule    Refill:  0   tamsulosin (FLOMAX) 0.4 MG CAPS capsule    Sig: Take 1 capsule (0.4 mg total) by mouth 2 (two) times daily.    Dispense:  60 capsule    Refill:  2     Chief Complaint: No chief complaint on file.   HPI: [name] is a 88 y.o. male who presents for continued evaluation of BPH with urinary symptoms and acute prostatitis.  He has completed doxycycline for 3 weeks with improvement of his symptoms.  He c/o postvoid dribbling, voids 3 times per night, and feels the urge to void, but cannot.  No gross hematuria or dysuria.  He is on tamsulosin 0.4 milligrams in the morning, finasteride grams daily and Cardura 4 mg daily.  IPSS time 13, Q OL = 3  PVR = 38 ml Patient is unable to collect specimen for urinalysis as he voided prior to arrival. . 10/28/22 [name] is a 88 y.o. year old male residing at [facility] in [place] who is seen in consultation from [place], [place], FNP  for evaluation of urinary hesitancy, intermittency, and nocturia 7-8x/night for the past 3 weeks. Pt states he spent an entire day without being able to void approximately 3 weeks ago and recalls that he had a large BM and was able to void after.  Since that time he continues to have hesitancy,  intermittency, urgency.  He denies dysuria, burning, gross hematuria.  No significant increase in daytime frequency.  He denies history of urinary stones and reports no history of UTIs.  The patient admits to drinking "a quart" of water before bed every night.  Patient denies constipation or hard stools. He has 1 decaf coffee in the morning and occasional tea throughout the day.  Medications reviewed from the facility indicate the patient is on finasteride 5 mg daily as well as tamsulosin 0.4 mg daily.  There are no medical records within the epic system or care everywhere indicating urinary complaints in the past. Patient's history is otherwise significant for pacemaker placement for complete heart block history, diabetes mellitus, hypertension, history of stroke x3.  The patient is unable to explain why he is in assisted living.  He reports his wife still lives in their home.  He is accompanied by [facility] facilities transportation driver.   UA= the patient is unable to give a specimen today as he states he does not feel he needs to void.  He admits to emptying his bladder just before arrival. Approx 1- 55ml on urine noted in specimen cup appears brownish gray and opaque to gross exam PVR=4ml IPSS=15, QOL=3 UA= PVR= IPSS=           QOL=  Portions of the above documentation were copied from a prior visit  for review purposes only.  Allergies: Allergies  Allergen Reactions   Aspirin     Upset stomach with full strength   Codeine     REACTION: syncope    PMH: Past Medical History:  Diagnosis Date   Aortic stenosis    mild   Broken ribs    Complete heart block (HCC)    s/p PPM   Diabetes mellitus    HL (hearing loss)    HTN (hypertension)    Malaise and fatigue    Stroke (HCC)    Neuro - 3x    PSH: Past Surgical History:  Procedure Laterality Date   EP IMPLANTABLE DEVICE N/A 11/14/2015   Procedure: PPM Generator Changeout;  Surgeon: Hillis Range, MD; [employer] Adapta L    HEMORRHOID SURGERY     medtronic senia  12/04/07   PPM implantaed by [facility]    SH: Social History   Tobacco Use   Smoking status: Former    Types: Cigarettes    Quit date: 02/20/1982    Years since quitting: 40.7   Smokeless tobacco: Never  Vaping Use   Vaping Use: Never used  Substance Use Topics   Alcohol use: No    Alcohol/week: 0.0 standard drinks of alcohol    ROS: All other review of systems were reviewed and are negative except what is noted above in HPI  PE: BP 124/76   Pulse 61  GENERAL APPEARANCE:  Well appearing, well developed, well nourished, NAD HEENT:  Atraumatic, normocephalic NECK:  Supple. Trachea midline ABDOMEN:  Soft, non-tender, no masses EXTREMITIES:  Moves all extremities well NEUROLOGIC:  Alert and oriented x 3, ambulates with cane MENTAL STATUS:  appropriate SKIN:  Warm, dry, and intact   Results: Laboratory Data: Lab Results  Component Value Date   WBC 6.4 11/24/2007   HGB 13.9 11/24/2007   HCT 40.8 11/24/2007   MCV 83.2 11/24/2007   PLT 203 11/24/2007    Lab Results  Component Value Date   CREATININE 0.78 03/02/2021    No results found for: "PSA"  No results found for: "TESTOSTERONE"  Lab Results  Component Value Date   HGBA1C 6.3 (H) 12/26/2020    Urinalysis No results found for: "COLORURINE", "APPEARANCEUR", "LABSPEC", "PHURINE", "GLUCOSEU", "HGBUR", "BILIRUBINUR", "KETONESUR", "PROTEINUR", "UROBILINOGEN", "NITRITE", "LEUKOCYTESUR"  No results found for: "LABMICR", "WBCUA", "RBCUA", "LABEPIT", "MUCUS", "BACTERIA"  Pertinent Imaging: No results found for this or any previous visit.  No results found for this or any previous visit.  No results found for this or any previous visit.  No results found for this or any previous visit.  No results found for this or any previous visit.  No valid procedures specified. No results found for this or any previous visit.  No results found for this or any previous  visit.  Results for orders placed or performed in visit on 11/25/22 (from the past 24 hour(s))  BLADDER SCAN AMB NON-IMAGING   Collection Time: 11/25/22 10:16 AM  Result Value Ref Range   Scan Result 39

## 2022-11-25 NOTE — Progress Notes (Signed)
post void residual =39mL 

## 2023-01-20 ENCOUNTER — Ambulatory Visit (INDEPENDENT_AMBULATORY_CARE_PROVIDER_SITE_OTHER): Payer: 59 | Admitting: Urology

## 2023-01-20 ENCOUNTER — Encounter: Payer: Self-pay | Admitting: Urology

## 2023-01-20 VITALS — BP 105/66 | HR 80

## 2023-01-20 DIAGNOSIS — N401 Enlarged prostate with lower urinary tract symptoms: Secondary | ICD-10-CM | POA: Diagnosis not present

## 2023-01-20 DIAGNOSIS — R351 Nocturia: Secondary | ICD-10-CM

## 2023-01-20 DIAGNOSIS — N3943 Post-void dribbling: Secondary | ICD-10-CM | POA: Diagnosis not present

## 2023-01-20 DIAGNOSIS — R3911 Hesitancy of micturition: Secondary | ICD-10-CM | POA: Diagnosis not present

## 2023-01-20 LAB — URINALYSIS, ROUTINE W REFLEX MICROSCOPIC
Bilirubin, UA: NEGATIVE
Glucose, UA: NEGATIVE
Ketones, UA: NEGATIVE
Leukocytes,UA: NEGATIVE
Nitrite, UA: NEGATIVE
Specific Gravity, UA: 1.015 (ref 1.005–1.030)
Urobilinogen, Ur: 1 mg/dL (ref 0.2–1.0)
pH, UA: 5 (ref 5.0–7.5)

## 2023-01-20 LAB — MICROSCOPIC EXAMINATION: Bacteria, UA: NONE SEEN

## 2023-01-20 NOTE — Progress Notes (Signed)
01/20/2023 11:49 AM   Fin Jenetta Downer [place]leigh Oct 30, 1933 [id number]  Referring provider: Wannetta Sender, FNP [facility] [address] [place],  [place] [address]  Followup BPH  HPI: Mr Kelter is a 89yo for followup for BPH with urinary hesitancy and nocturia. At last visit he was treated for 7 weeks with doxycycline. IPSS 10 QOl 0 on flomax 0.4mg  BID. Nocturia 2-5x depending on fluid consumption. His urinary hesitancy has improved since last visit. No straining to urinate. No other complaints today   PMH: Past Medical History:  Diagnosis Date   Aortic stenosis    mild   Broken ribs    Complete heart block (HCC)    s/p PPM   Diabetes mellitus    HL (hearing loss)    HTN (hypertension)    Malaise and fatigue    Stroke [facility])    Neuro - 3x    Surgical History: Past Surgical History:  Procedure Laterality Date   EP IMPLANTABLE DEVICE N/A 11/14/2015   Procedure: PPM Generator Changeout;  Surgeon: Thompson Grayer, MD; [facility]  12/04/07   PPM implantaed by [facility]    Home Medications:  Allergies as of 01/20/2023       Reactions   Aspirin    Upset stomach with full strength   Codeine    REACTION: syncope        Medication List        Accurate as of January 20, 2023 11:49 AM. If you have any questions, ask your nurse or doctor.          amLODipine 10 MG tablet Commonly known as: NORVASC Take 10 mg by mouth daily.   atorvastatin 40 MG tablet Commonly known as: LIPITOR Take 40 mg by mouth daily.   clopidogrel 75 MG tablet Commonly known as: PLAVIX Take 1 tablet (75 mg total) by mouth daily.   doxazosin 4 MG tablet Commonly known as: CARDURA Take 4 mg by mouth daily.   doxycycline 100 MG capsule Commonly known as: VIBRAMYCIN Take 1 capsule (100 mg total) by mouth every 12 (twelve) hours.   finasteride 5 MG tablet Commonly known as: PROSCAR Take 5 mg by mouth  daily.   latanoprost 0.005 % ophthalmic solution Commonly known as: XALATAN 1 drop at bedtime.   levothyroxine 75 MCG tablet Commonly known as: SYNTHROID Take 75 mcg by mouth daily before breakfast.   losartan 100 MG tablet Commonly known as: COZAAR Take 100 mg by mouth daily.   metFORMIN 1000 MG (MOD) 24 hr tablet Commonly known as: GLUMETZA Take 1,000 mg by mouth 2 (two) times daily.   Multivital tablet Take 1 tablet by mouth daily.   PreserVision AREDS Caps Take 1 capsule by mouth 2 (two) times daily.   pantoprazole 40 MG tablet Commonly known as: PROTONIX Take 40 mg by mouth daily.   tamsulosin 0.4 MG Caps capsule Commonly known as: FLOMAX Take 1 capsule (0.4 mg total) by mouth 2 (two) times daily.   travoprost (benzalkonium) 0.004 % ophthalmic solution Commonly known as: TRAVATAN Place 1 drop into both eyes at bedtime.   VITAMIN B-12 PO Take by mouth daily.   Vitamin D3 125 MCG (5000 UT) Caps Take by mouth daily.        Allergies:  Allergies  Allergen Reactions   Aspirin     Upset stomach with full strength   Codeine  REACTION: syncope    Family History: Family History  Problem Relation Age of Onset   Hypertension Other     Social History:  reports that he quit smoking about 40 years ago. He has never used smokeless tobacco. He reports that he does not drink alcohol. No history on file for drug use.  ROS: All other review of systems were reviewed and are negative except what is noted above in HPI  Physical Exam: BP 105/66   Pulse 80   Constitutional:  Alert and oriented, No acute distress. HEENT: [place] AT, moist mucus membranes.  Trachea midline, no masses. Cardiovascular: No clubbing, cyanosis, or edema. Respiratory: Normal respiratory effort, no increased work of breathing. GI: Abdomen is soft, nontender, nondistended, no abdominal masses GU: No CVA tenderness.  Lymph: No cervical or inguinal lymphadenopathy. Skin: No rashes, bruises  or suspicious lesions. Neurologic: Grossly intact, no focal deficits, moving all 4 extremities. Psychiatric: Normal mood and affect.  Laboratory Data: Lab Results  Component Value Date   WBC 6.4 11/24/2007   HGB 13.9 11/24/2007   HCT 40.8 11/24/2007   MCV 83.2 11/24/2007   PLT 203 11/24/2007    Lab Results  Component Value Date   CREATININE 0.78 03/02/2021    No results found for: "PSA"  No results found for: "TESTOSTERONE"  Lab Results  Component Value Date   HGBA1C 6.3 (H) 12/26/2020    Urinalysis No results found for: "COLORURINE", "APPEARANCEUR", "LABSPEC", "PHURINE", "GLUCOSEU", "HGBUR", "BILIRUBINUR", "KETONESUR", "PROTEINUR", "UROBILINOGEN", "NITRITE", "LEUKOCYTESUR"  No results found for: "LABMICR", "WBCUA", "RBCUA", "LABEPIT", "MUCUS", "BACTERIA"  Pertinent Imaging:  No results found for this or any previous visit.  No results found for this or any previous visit.  No results found for this or any previous visit.  No results found for this or any previous visit.  No results found for this or any previous visit.  No valid procedures specified. No results found for this or any previous visit.  No results found for this or any previous visit.   Assessment & Plan:    1. Benign prostatic hyperplasia with post-void dribbling -continue floma x0.4mg  BID - Urinalysis, Routine w reflex microscopic  2. Urinary hesitancy Flomax 0.4mg  BID  3. Nocturia -decrease fluid consumtpion within 2 hours of going to bed   No follow-ups on file.  Nicolette Bang, MD  [facility] [place]

## 2023-01-20 NOTE — Patient Instructions (Signed)

## 2023-02-05 ENCOUNTER — Ambulatory Visit (INDEPENDENT_AMBULATORY_CARE_PROVIDER_SITE_OTHER): Payer: Medicare Other

## 2023-02-05 DIAGNOSIS — I442 Atrioventricular block, complete: Secondary | ICD-10-CM

## 2023-02-06 LAB — CUP PACEART REMOTE DEVICE CHECK
Battery Impedance: 880 Ohm
Battery Remaining Longevity: 53 mo
Battery Voltage: 2.77 V
Brady Statistic AP VP Percent: 39 %
Brady Statistic AP VS Percent: 0 %
Brady Statistic AS VP Percent: 61 %
Brady Statistic AS VS Percent: 0 %
Date Time Interrogation Session: 20240208104935
Implantable Lead Connection Status: 753985
Implantable Lead Connection Status: 753985
Implantable Lead Implant Date: 20081205
Implantable Lead Implant Date: 20081205
Implantable Lead Location: 753859
Implantable Lead Location: 753860
Implantable Lead Model: 5076
Implantable Lead Model: 5076
Implantable Pulse Generator Implant Date: 20161115
Lead Channel Impedance Value: 418 Ohm
Lead Channel Impedance Value: 478 Ohm
Lead Channel Pacing Threshold Amplitude: 1 V
Lead Channel Pacing Threshold Amplitude: 1.625 V
Lead Channel Pacing Threshold Pulse Width: 0.4 ms
Lead Channel Pacing Threshold Pulse Width: 0.4 ms
Lead Channel Setting Pacing Amplitude: 2 V
Lead Channel Setting Pacing Amplitude: 3.25 V
Lead Channel Setting Pacing Pulse Width: 0.4 ms
Lead Channel Setting Sensing Sensitivity: 4 mV
Zone Setting Status: 755011
Zone Setting Status: 755011

## 2023-03-04 NOTE — Progress Notes (Signed)
Remote pacemaker transmission.   

## 2023-05-02 ENCOUNTER — Encounter: Payer: Medicare Other | Admitting: Internal Medicine

## 2023-05-02 ENCOUNTER — Encounter: Payer: Self-pay | Admitting: Cardiovascular Disease

## 2023-05-02 ENCOUNTER — Ambulatory Visit: Payer: Non-veteran care | Attending: Internal Medicine | Admitting: Cardiovascular Disease

## 2023-05-02 VITALS — BP 98/64 | HR 68 | Ht 72.0 in | Wt 184.8 lb

## 2023-05-02 DIAGNOSIS — I442 Atrioventricular block, complete: Secondary | ICD-10-CM | POA: Diagnosis not present

## 2023-05-02 LAB — CUP PACEART INCLINIC DEVICE CHECK
Battery Impedance: 984 Ohm
Battery Remaining Longevity: 57 mo
Battery Voltage: 2.77 V
Brady Statistic AP VP Percent: 38 %
Brady Statistic AP VS Percent: 0 %
Brady Statistic AS VP Percent: 61 %
Brady Statistic AS VS Percent: 0 %
Date Time Interrogation Session: 20240503131040
Implantable Lead Connection Status: 753985
Implantable Lead Connection Status: 753985
Implantable Lead Implant Date: 20081205
Implantable Lead Implant Date: 20081205
Implantable Lead Location: 753859
Implantable Lead Location: 753860
Implantable Lead Model: 5076
Implantable Lead Model: 5076
Implantable Pulse Generator Implant Date: 20161115
Lead Channel Impedance Value: 418 Ohm
Lead Channel Impedance Value: 580 Ohm
Lead Channel Pacing Threshold Amplitude: 1.125 V
Lead Channel Pacing Threshold Amplitude: 1.125 V
Lead Channel Pacing Threshold Pulse Width: 0.4 ms
Lead Channel Pacing Threshold Pulse Width: 0.4 ms
Lead Channel Sensing Intrinsic Amplitude: 2 mV
Lead Channel Setting Pacing Amplitude: 2.25 V
Lead Channel Setting Pacing Amplitude: 2.5 V
Lead Channel Setting Pacing Pulse Width: 0.4 ms
Lead Channel Setting Sensing Sensitivity: 4 mV
Zone Setting Status: 755011
Zone Setting Status: 755011

## 2023-05-02 NOTE — Patient Instructions (Signed)
Medication Instructions:  Continue all current medications.  Labwork: none  Testing/Procedures: none  Follow-Up: 1 year - Dr.  Mealor   Any Other Special Instructions Will Be Listed Below (If Applicable).   If you need a refill on your cardiac medications before your next appointment, please call your pharmacy.; 

## 2023-05-02 NOTE — Progress Notes (Signed)
PCP: Galvin Proffer, MD   Primary EP:  Dr Johney Frame  [name] is a 89 y.o. male who presents today for routine electrophysiology followup.  Since last being seen in our clinic, the patient reports doing very well.  Today, he denies symptoms of palpitations, chest pain, shortness of breath,  lower extremity edema, dizziness, presyncope, or syncope.  The patient is otherwise without complaint today.   Past Medical History:  Diagnosis Date   Aortic stenosis    mild   Broken ribs    Complete heart block (HCC)    s/p PPM   Diabetes mellitus    HL (hearing loss)    HTN (hypertension)    Malaise and fatigue    Stroke [facility])    Neuro - 3x   Past Surgical History:  Procedure Laterality Date   EP IMPLANTABLE DEVICE N/A 11/14/2015   Procedure: PPM Generator Changeout;  Surgeon: Hillis Range, MD; Medtronic Adapta L   HEMORRHOID SURGERY     medtronic senia  12/04/07   PPM implantaed by GT    ROS- all systems are reviewed and negative except as per HPI above  Current Outpatient Medications  Medication Sig Dispense Refill   amLODipine (NORVASC) 10 MG tablet Take 10 mg by mouth daily.     atorvastatin (LIPITOR) 40 MG tablet Take 40 mg by mouth daily.     cholecalciferol (VITAMIN D3) 25 MCG (1000 UNIT) tablet Take 1,000 Units by mouth daily.     clopidogrel (PLAVIX) 75 MG tablet Take 1 tablet (75 mg total) by mouth daily. 30 tablet 11   Cyanocobalamin (VITAMIN B-12 PO) Take 1,000 mcg by mouth daily.     diclofenac Sodium (VOLTAREN) 1 % GEL Apply 2 g topically 2 (two) times daily as needed.     fenofibrate 54 MG tablet Take 54 mg by mouth daily.     ferrous sulfate 325 (65 FE) MG EC tablet Take 325 mg by mouth 2 (two) times daily.     finasteride (PROSCAR) 5 MG tablet Take 5 mg by mouth daily.     latanoprost (XALATAN) 0.005 % ophthalmic solution Place 1 drop into both eyes at bedtime.     levothyroxine (SYNTHROID) 75 MCG tablet Take 75 mcg by mouth daily before breakfast.       losartan (COZAAR) 100 MG tablet Take 100 mg by mouth daily.     memantine (NAMENDA) 10 MG tablet Take 10 mg by mouth 2 (two) times daily.     metFORMIN (GLUCOPHAGE) 500 MG tablet Take 500 mg by mouth daily with breakfast.     Multiple Vitamins-Minerals (PRESERVISION AREDS) CAPS Take 1 capsule by mouth 2 (two) times daily.     tamsulosin (FLOMAX) 0.4 MG CAPS capsule Take 1 capsule (0.4 mg total) by mouth 2 (two) times daily. 60 capsule 2   No current facility-administered medications for this visit.    Physical Exam: Vitals:   05/02/23 1101  BP: 98/64  Pulse: 68  SpO2: 96%  Weight: 184 lb 12.8 oz (83.8 kg)  Height: 6' (1.829 m)    Gen: Appears comfortable, well-nourished CV: RRR, no dependent edema The device site is normal -- no tenderness, edema, drainage, redness, threatened erosion. Pulm: breathing easily   Pacemaker interrogation- reviewed in detail today,  See PACEART Mcclure  ekg tracing ordered today is personally reviewed and shows sinus with V pacing  Assessment and Plan:  1. Symptomatic  complete heart block Normal pacemaker function See [name]  No changes today he is device dependant today  2. HTN Stable No change required today  3. Moderate AS Clinically stable  Return in a year  Frank Small, MD 05/02/2023 11:31 AM

## 2023-05-07 ENCOUNTER — Ambulatory Visit (INDEPENDENT_AMBULATORY_CARE_PROVIDER_SITE_OTHER): Payer: 59

## 2023-05-07 DIAGNOSIS — I442 Atrioventricular block, complete: Secondary | ICD-10-CM

## 2023-05-09 LAB — CUP PACEART REMOTE DEVICE CHECK
Battery Impedance: 959 Ohm
Battery Remaining Longevity: 57 mo
Battery Voltage: 2.77 V
Brady Statistic AP VP Percent: 32 %
Brady Statistic AP VS Percent: 0 %
Brady Statistic AS VP Percent: 68 %
Brady Statistic AS VS Percent: 0 %
Date Time Interrogation Session: 20240510104746
Implantable Lead Connection Status: 753985
Implantable Lead Connection Status: 753985
Implantable Lead Implant Date: 20081205
Implantable Lead Implant Date: 20081205
Implantable Lead Location: 753859
Implantable Lead Location: 753860
Implantable Lead Model: 5076
Implantable Lead Model: 5076
Implantable Pulse Generator Implant Date: 20161115
Lead Channel Impedance Value: 428 Ohm
Lead Channel Impedance Value: 490 Ohm
Lead Channel Pacing Threshold Amplitude: 1.125 V
Lead Channel Pacing Threshold Amplitude: 1.125 V
Lead Channel Pacing Threshold Pulse Width: 0.4 ms
Lead Channel Pacing Threshold Pulse Width: 0.4 ms
Lead Channel Setting Pacing Amplitude: 2.25 V
Lead Channel Setting Pacing Amplitude: 2.5 V
Lead Channel Setting Pacing Pulse Width: 0.4 ms
Lead Channel Setting Sensing Sensitivity: 4 mV
Zone Setting Status: 755011
Zone Setting Status: 755011

## 2023-05-28 NOTE — Progress Notes (Signed)
Remote pacemaker transmission.   

## 2023-07-23 ENCOUNTER — Ambulatory Visit (INDEPENDENT_AMBULATORY_CARE_PROVIDER_SITE_OTHER): Payer: 59 | Admitting: Urology

## 2023-07-23 VITALS — BP 91/55 | HR 116

## 2023-07-23 DIAGNOSIS — R351 Nocturia: Secondary | ICD-10-CM

## 2023-07-23 DIAGNOSIS — N401 Enlarged prostate with lower urinary tract symptoms: Secondary | ICD-10-CM | POA: Diagnosis not present

## 2023-07-23 DIAGNOSIS — N3943 Post-void dribbling: Secondary | ICD-10-CM | POA: Diagnosis not present

## 2023-07-23 DIAGNOSIS — R3911 Hesitancy of micturition: Secondary | ICD-10-CM

## 2023-07-23 LAB — BLADDER SCAN AMB NON-IMAGING: Scan Result: 0

## 2023-07-23 MED ORDER — TAMSULOSIN HCL 0.4 MG PO CAPS: 0.4000 mg | ORAL_CAPSULE | Freq: Two times a day (BID) | ORAL | 3 refills | Status: AC

## 2023-07-23 NOTE — Progress Notes (Signed)
post void residual=0 ?

## 2023-07-23 NOTE — Progress Notes (Signed)
07/23/2023 10:50 AM   [name] 1933-07-03 [id number]  Referring provider: Jason Coop, FNP [facility] [address] [place],  [place] [address]  Followup BPH and nocturia   HPI: [name] is a 89yo here for BPH with nocturia and urinary hesitancy. IPSS 3 QOL 1 on flomax BID. Nocturia 3x. Urine stream strong. No straining to urinate.    PMH: Past Medical History:  Diagnosis Date   Aortic stenosis    mild   Broken ribs    Complete heart block (HCC)    s/p PPM   Diabetes mellitus    HL (hearing loss)    HTN (hypertension)    Malaise and fatigue    Stroke [facility])    Neuro - 3x    Surgical History: Past Surgical History:  Procedure Laterality Date   EP IMPLANTABLE DEVICE N/A 11/14/2015   Procedure: PPM Generator Changeout;  Surgeon: Hillis Range, MD; [employer] Adapta L   HEMORRHOID SURGERY     medtronic senia  12/04/07   PPM implantaed by GT    Home Medications:  Allergies as of 07/23/2023       Reactions   Aspirin    Upset stomach with full strength   Codeine    REACTION: syncope        Medication List        Accurate as of July 23, 2023 10:50 AM. If you have any questions, ask your nurse or doctor.          amLODipine 10 MG tablet Commonly known as: NORVASC Take 10 mg by mouth daily.   atorvastatin 40 MG tablet Commonly known as: LIPITOR Take 40 mg by mouth daily.   cholecalciferol 25 MCG (1000 UNIT) tablet Commonly known as: VITAMIN D3 Take 1,000 Units by mouth daily.   clopidogrel 75 MG tablet Commonly known as: PLAVIX Take 1 tablet (75 mg total) by mouth daily.   diclofenac Sodium 1 % Gel Commonly known as: VOLTAREN Apply 2 g topically 2 (two) times daily as needed.   fenofibrate 54 MG tablet Take 54 mg by mouth daily.   ferrous sulfate 325 (65 FE) MG EC tablet Take 325 mg by mouth 2 (two) times daily.   finasteride 5 MG tablet Commonly known as: PROSCAR Take 5 mg by  mouth daily.   latanoprost 0.005 % ophthalmic solution Commonly known as: XALATAN Place 1 drop into both eyes at bedtime.   levothyroxine 75 MCG tablet Commonly known as: SYNTHROID Take 75 mcg by mouth daily before breakfast.   losartan 100 MG tablet Commonly known as: COZAAR Take 100 mg by mouth daily.   memantine 10 MG tablet Commonly known as: NAMENDA Take 10 mg by mouth 2 (two) times daily.   metFORMIN 500 MG tablet Commonly known as: GLUCOPHAGE Take 500 mg by mouth daily with breakfast.   PreserVision AREDS Caps Take 1 capsule by mouth 2 (two) times daily.   tamsulosin 0.4 MG Caps capsule Commonly known as: FLOMAX Take 1 capsule (0.4 mg total) by mouth 2 (two) times daily.   VITAMIN B-12 PO Take 1,000 mcg by mouth daily.        Allergies:  Allergies  Allergen Reactions   Aspirin     Upset stomach with full strength   Codeine     REACTION: syncope    Family History: Family History  Problem Relation Age of Onset   Hypertension Other     Social History:  reports that he quit smoking about 41 years ago. He has never used smokeless tobacco. He reports that he does not drink alcohol. No history on file for drug use.  ROS: All other review of systems were reviewed and are negative except what is noted above in HPI  Physical Exam: BP (!) 91/55   Pulse (!) 116   Constitutional:  Alert and oriented, No acute distress. HEENT: [place] AT, moist mucus membranes.  Trachea midline, no masses. Cardiovascular: No clubbing, cyanosis, or edema. Respiratory: Normal respiratory effort, no increased work of breathing. GI: Abdomen is soft, nontender, nondistended, no abdominal masses GU: No CVA tenderness.  Lymph: No cervical or inguinal lymphadenopathy. Skin: No rashes, bruises or suspicious lesions. Neurologic: Grossly intact, no focal deficits, moving all 4 extremities. Psychiatric: Normal mood and affect.  Laboratory Data: Lab Results  Component Value Date   WBC  6.4 11/24/2007   HGB 13.9 11/24/2007   HCT 40.8 11/24/2007   MCV 83.2 11/24/2007   PLT 203 11/24/2007    Lab Results  Component Value Date   CREATININE 0.78 03/02/2021    No results found for: "PSA"  No results found for: "TESTOSTERONE"  Lab Results  Component Value Date   HGBA1C 6.3 (H) 12/26/2020    Urinalysis    Component Value Date/Time   APPEARANCEUR Clear 01/20/2023 1128   GLUCOSEU Negative 01/20/2023 1128   BILIRUBINUR Negative 01/20/2023 1128   PROTEINUR Trace 01/20/2023 1128   NITRITE Negative 01/20/2023 1128   LEUKOCYTESUR Negative 01/20/2023 1128    Lab Results  Component Value Date   LABMICR See below: 01/20/2023   WBCUA 0-5 01/20/2023   LABEPIT 0-10 01/20/2023   BACTERIA None seen 01/20/2023    Pertinent Imaging:  No results found for this or any previous visit.  No results found for this or any previous visit.  No results found for this or any previous visit.  No results found for this or any previous visit.  No results found for this or any previous visit.  No valid procedures specified. No results found for this or any previous visit.  No results found for this or any previous visit.   Assessment & Plan:    1. Benign prostatic hyperplasia with post-void dribbling Continue flomax BID. Followup 1 year - Urinalysis, Routine w reflex microscopic - BLADDER SCAN AMB NON-IMAGING  2. Nocturia Continue flomax 0.4mg  BID  3. Urinary hesitancy Continue flomax 0.4mg  BID   No follow-ups on file.  Wilkie Aye, MD  [facility] [place]

## 2023-07-29 ENCOUNTER — Encounter: Payer: Self-pay | Admitting: Urology

## 2023-07-29 NOTE — Patient Instructions (Signed)

## 2023-12-18 LAB — TSH: TSH: 1.43 (ref 0.41–5.90)

## 2023-12-18 LAB — BASIC METABOLIC PANEL WITH GFR
BUN: 27 — AB (ref 4–21)
Creatinine: 1.2 (ref 0.6–1.3)
Glucose: 85

## 2023-12-18 LAB — HEMOGLOBIN A1C: Hemoglobin A1C: 5.9

## 2023-12-18 LAB — COMPREHENSIVE METABOLIC PANEL WITH GFR: eGFR: 60

## 2023-12-18 LAB — LIPID PANEL
LDL Cholesterol: 26
Triglycerides: 72 (ref 40–160)

## 2024-01-19 ENCOUNTER — Telehealth: Payer: Self-pay

## 2024-01-19 NOTE — Telephone Encounter (Signed)
I spoke with the nursing home and tried to call [facility] tech support to get help but they are closed today.  The nursing home is going to call tech support tomorrow.

## 2024-01-19 NOTE — Telephone Encounter (Signed)
Spoke with nurse from nursing home and she is aware pt needs to send a transmission and she will send it in an hour

## 2024-02-04 ENCOUNTER — Ambulatory Visit: Payer: 59

## 2024-02-04 DIAGNOSIS — I442 Atrioventricular block, complete: Secondary | ICD-10-CM | POA: Diagnosis not present

## 2024-02-04 LAB — CUP PACEART REMOTE DEVICE CHECK
Battery Impedance: 1334 Ohm
Battery Remaining Longevity: 45 mo
Battery Voltage: 2.76 V
Brady Statistic AP VP Percent: 43 %
Brady Statistic AP VS Percent: 0 %
Brady Statistic AS VP Percent: 57 %
Brady Statistic AS VS Percent: 0 %
Date Time Interrogation Session: 20250204102833
Implantable Lead Connection Status: 753985
Implantable Lead Connection Status: 753985
Implantable Lead Implant Date: 20081205
Implantable Lead Implant Date: 20081205
Implantable Lead Location: 753859
Implantable Lead Location: 753860
Implantable Lead Model: 5076
Implantable Lead Model: 5076
Implantable Pulse Generator Implant Date: 20161115
Lead Channel Impedance Value: 418 Ohm
Lead Channel Impedance Value: 480 Ohm
Lead Channel Pacing Threshold Amplitude: 1 V
Lead Channel Pacing Threshold Amplitude: 1.125 V
Lead Channel Pacing Threshold Pulse Width: 0.4 ms
Lead Channel Pacing Threshold Pulse Width: 0.4 ms
Lead Channel Setting Pacing Amplitude: 2 V
Lead Channel Setting Pacing Amplitude: 2.5 V
Lead Channel Setting Pacing Pulse Width: 0.4 ms
Lead Channel Setting Sensing Sensitivity: 4 mV
Zone Setting Status: 755011
Zone Setting Status: 755011

## 2024-03-12 NOTE — Progress Notes (Signed)
 Remote pacemaker transmission.

## 2024-04-30 ENCOUNTER — Ambulatory Visit: Payer: Non-veteran care | Attending: Cardiovascular Disease | Admitting: Cardiovascular Disease

## 2024-06-02 ENCOUNTER — Encounter: Payer: Medicare Other | Admitting: Nurse Practitioner

## 2024-06-02 ENCOUNTER — Encounter: Payer: Self-pay | Admitting: Nurse Practitioner

## 2024-06-02 DIAGNOSIS — E782 Mixed hyperlipidemia: Secondary | ICD-10-CM

## 2024-06-02 DIAGNOSIS — I10 Essential (primary) hypertension: Secondary | ICD-10-CM

## 2024-06-02 DIAGNOSIS — E1122 Type 2 diabetes mellitus with diabetic chronic kidney disease: Secondary | ICD-10-CM

## 2024-06-02 NOTE — Progress Notes (Unsigned)
 Erroneous encounter

## 2024-06-07 ENCOUNTER — Encounter: Payer: Self-pay | Admitting: Cardiovascular Disease

## 2024-06-10 ENCOUNTER — Telehealth: Payer: Self-pay | Admitting: "Endocrinology

## 2024-06-10 NOTE — Telephone Encounter (Signed)
 Called facility 2x to get pt rescheduled. NO ANSWER OR NO WAY TO LEAVE A VM. If they call back offer the week of July 21

## 2024-07-21 ENCOUNTER — Ambulatory Visit: Payer: Non-veteran care | Admitting: Urology

## 2024-10-04 NOTE — Patient Instructions (Signed)

## 2024-10-06 ENCOUNTER — Encounter: Payer: Self-pay | Admitting: Nurse Practitioner

## 2024-10-06 ENCOUNTER — Ambulatory Visit (INDEPENDENT_AMBULATORY_CARE_PROVIDER_SITE_OTHER): Admitting: Nurse Practitioner

## 2024-10-06 VITALS — BP 104/82 | HR 70 | Ht 72.0 in | Wt 185.8 lb

## 2024-10-06 DIAGNOSIS — I10 Essential (primary) hypertension: Secondary | ICD-10-CM

## 2024-10-06 DIAGNOSIS — E119 Type 2 diabetes mellitus without complications: Secondary | ICD-10-CM

## 2024-10-06 DIAGNOSIS — E782 Mixed hyperlipidemia: Secondary | ICD-10-CM | POA: Diagnosis not present

## 2024-10-06 NOTE — Progress Notes (Signed)
 Endocrinology Consult Note       10/06/2024, 10:10 AM   Subjective:    Patient ID: [name], male    DOB: [birth year].  [name] is being seen in consultation for management of currently controlled symptomatic diabetes requested by  Pia Kerney SQUIBB, MD.   Past Medical History:  Diagnosis Date   Aortic stenosis    mild   Broken ribs    Complete heart block (HCC)    s/p PPM   Diabetes mellitus    HL (hearing loss)    HTN (hypertension)    Malaise and fatigue    Stroke [facility])    Neuro - 3x    Past Surgical History:  Procedure Laterality Date   EP IMPLANTABLE DEVICE N/A 11/14/2015   Procedure: PPM Generator Changeout;  Surgeon: Lynwood Rakers, MD; Medtr[employer] Adapta L   HEMORRHOID SURGERY     medtronic senia  12/04/07   PPM implantaed by GT    Social History   Socioeconomic History   Marital status: Married    Spouse name: Patsy   Number of children: Not on file   Years of education: Not on file   Highest education level: Not on file  Occupational History   Not on file  Tobacco Use   Smoking status: Former    Current packs/day: 0.00    Types: Cigarettes    Quit date: 02/20/1982    Years since quitting: 42.6   Smokeless tobacco: Never  Vaping Use   Vaping status: Never Used  Substance and Sexual Activity   Alcohol use: No    Alcohol/week: 0.0 standard drinks of alcohol   Drug use: Never   Sexual activity: Not on file  Other Topics Concern   Not on file  Social History Narrative   Lives with Wife, Patsy   Right Handed   Drinks no caffeine   Social Drivers of Corporate investment banker Strain: Not on file  Food Insecurity: No Food Insecurity (12/19/2020)   Received from [facility]   Hunger Vital Sign    Within the past 12 months, you worried that your food would run out before you got the money to buy more.: Never true    Within the past 12 months, the food you bought  just didn't last and you didn't have money to get more.: Never true  Transportation Needs: No Transportation Needs (12/19/2020)   Received from [facility]   PRAPARE - Transportation    Lack of Transportation (Medical): No    Lack of Transportation (Non-Medical): No  Physical Activity: Not on file  Stress: Not on file  Social Connections: Not on file    Family History  Problem Relation Age of Onset   Hypertension Other     Outpatient Encounter Medications as of 10/06/2024  Medication Sig   amLODipine (NORVASC) 10 MG tablet Take 10 mg by mouth daily.   atorvastatin (LIPITOR) 40 MG tablet Take 40 mg by mouth daily.   cholecalciferol (VITAMIN D3) 25 MCG (1000 UNIT) tablet Take 1,000 Units by mouth daily.   clopidogrel  (PLAVIX ) 75 MG tablet Take 1 tablet (75 mg total) by mouth daily.  Cyanocobalamin (VITAMIN B-12 PO) Take 1,000 mcg by mouth daily.   diclofenac Sodium (VOLTAREN) 1 % GEL Apply 2 g topically 2 (two) times daily as needed.   fenofibrate 54 MG tablet Take 54 mg by mouth daily.   ferrous sulfate 325 (65 FE) MG EC tablet Take 325 mg by mouth 2 (two) times daily.   finasteride (PROSCAR) 5 MG tablet Take 5 mg by mouth daily.   latanoprost (XALATAN) 0.005 % ophthalmic solution Place 1 drop into both eyes at bedtime.   levothyroxine (SYNTHROID) 75 MCG tablet Take 75 mcg by mouth daily before breakfast.    losartan (COZAAR) 100 MG tablet Take 100 mg by mouth daily.   memantine (NAMENDA) 10 MG tablet Take 10 mg by mouth 2 (two) times daily.   Multiple Vitamins-Minerals (PRESERVISION AREDS) CAPS Take 1 capsule by mouth 2 (two) times daily.   tamsulosin  (FLOMAX ) 0.4 MG CAPS capsule Take 1 capsule (0.4 mg total) by mouth 2 (two) times daily.   [DISCONTINUED] metFORMIN (GLUCOPHAGE) 500 MG tablet Take 500 mg by mouth daily with breakfast. (Patient not taking: Reported on 10/06/2024)   No facility-administered encounter medications on file as of 10/06/2024.    ALLERGIES: Allergies   Allergen Reactions   Aspirin     Upset stomach with full strength   Codeine     REACTION: syncope    VACCINATION STATUS: Immunization History  Administered Date(s) Administered   PFIZER Comirnaty(Gray [place])Covid-19 Tri-Sucrose Vaccine 02/13/2021, 07/04/2021    Diabetes He presents for his initial diabetic visit. He has type 2 diabetes mellitus. Onset time: diagnosed in 1999- was on shots and then transitioned to oral meds, then stopped several months ago. His disease course has been improving. There are no hypoglycemic associated symptoms. There are no diabetic associated symptoms. There are no hypoglycemic complications. Diabetic complications include a CVA and heart disease. Risk factors for coronary artery disease include male sex and diabetes mellitus. When asked about current treatments, none were reported. His weight is stable. He is following a generally healthy diet. When asked about meal planning, he reported none. He has not had a previous visit with a dietitian. He rarely participates in exercise. His breakfast blood glucose range is generally 90-110 mg/dl. His bedtime blood glucose range is generally 110-130 mg/dl. (He presents today, accompanied by his son, with his logs from the nursing home showing at goal fasting and postprandial readings.  His most recent A1c on 10/1 was 6%, essentially unchanged from his previous reading of 5.9%.  He is not currently on any medications for his diabetes.  Glucose in the morning ranges between 80-110 and evening sugars range between 110-130.  His son notes the patient obsesses over his glucose, was told by another doctor to keep his night sugars above 100 (but this was when he was on medications).  He is on a memory care unit at [facility], thus he does not have much choice in food.  He does occasionally snack at night.  He uses cane for ambulation.)     Review of systems  Constitutional: + Minimally fluctuating body weight, current Body mass  index is 25.2 kg/m., no fatigue, no subjective hyperthermia, no subjective hypothermia, mild memory impairment (per son- on memory care unit) Eyes: no blurry vision, no xerophthalmia ENT: no sore throat, no nodules palpated in throat, no dysphagia/odynophagia, no hoarseness Cardiovascular: no chest pain, no shortness of breath, no palpitations, no leg swelling, has pacemaker Respiratory: no cough, no shortness of breath Gastrointestinal: no nausea/vomiting/diarrhea  Musculoskeletal: + diffuse muscle/joint aches-uses cane Skin: no rashes, no hyperemia Neurological: no tremors, no numbness, no tingling, no dizziness Psychiatric: no depression, no anxiety  Objective:     BP 104/82 (BP Location: Left Arm, Patient Position: Sitting, Cuff Size: Large)   Pulse 70   Ht 6' (1.829 m)   Wt 185 lb 12.8 oz (84.3 kg)   BMI 25.20 kg/m   Wt Readings from Last 3 Encounters:  10/06/24 185 lb 12.8 oz (84.3 kg)  05/02/23 184 lb 12.8 oz (83.8 kg)  10/28/22 185 lb (83.9 kg)     BP Readings from Last 3 Encounters:  10/06/24 104/82  07/23/23 (!) 91/55  05/02/23 98/64     Physical Exam- Limited  Constitutional:  Body mass index is 25.2 kg/m. , not in acute distress, normal state of mind Eyes:  EOMI, no exophthalmos Neck: Supple Thyroid: No gross goiter Cardiovascular: RRR, + murmur (has pacemaker), rubs, or gallops, no edema Respiratory: Adequate breathing efforts, no crackles, rales, rhonchi, or wheezing Musculoskeletal: uses cane for deconditioning- unsteady on feet Skin:  no rashes, no hyperemia Neurological: no tremor with outstretched hands   Diabetic Foot Exam - Simple   No data filed      CMP ( most recent) CMP     Component Value Date/Time   NA 138 11/24/2007 1439   K 3.3 (L) 11/24/2007 1439   CL 100 11/24/2007 1439   CO2 29 11/24/2007 1439   GLUCOSE 109 (H) 11/24/2007 1439   BUN 27 (A) 12/18/2023 0000   CREATININE 1.2 12/18/2023 0000   CREATININE 0.78 03/02/2021 1020    CALCIUM 9.3 11/24/2007 1439   EGFR 60 12/18/2023 0000   EGFR 87 03/02/2021 1020   GFRNONAA 70 11/24/2007 1439     Diabetic Labs (most recent): Lab Results  Component Value Date   HGBA1C 5.9 12/18/2023   HGBA1C 6.3 (H) 12/26/2020     Lipid Panel ( most recent) Lipid Panel     Component Value Date/Time   CHOL 96 (L) 12/26/2020 0929   TRIG 72 12/18/2023 0000   HDL 36 (L) 12/26/2020 0929   CHOLHDL 2.7 12/26/2020 0929   LDLCALC 26 12/18/2023 0000   LDLCALC 36 12/26/2020 0929   LABVLDL 24 12/26/2020 0929      Lab Results  Component Value Date   TSH 1.43 12/18/2023           Assessment & Plan:   1) Type 2 diabetes mellitus without complication, without long-term current use of insulin (HCC) (Primary)    - [name] has currently controlled type 2 DM since 1999, with most recent A1c of 6 %.   -Recent labs reviewed.  - I had a long discussion with him about the progressive nature of diabetes and the pathology behind its complications. -his diabetes is complicated by CVA, CAD and he remains at a high risk for more acute and chronic complications which include CAD, CVA, CKD, retinopathy, and neuropathy. These are all discussed in detail with him.  The following Lifestyle Medicine recommendations according to American College of Lifestyle Medicine [facility]) were discussed and offered to patient and he agrees to start the journey:  A. Whole Foods, Plant-based plate comprising of fruits and vegetables, plant-based proteins, whole-grain carbohydrates was discussed in detail with the patient.   A list for source of those nutrients were also provided to the patient.  Patient will use only water or unsweetened tea for hydration. B.  The need to stay away from risky  substances including alcohol, smoking; obtaining 7 to 9 hours of restorative sleep, at least 150 minutes of moderate intensity exercise weekly, the importance of healthy social connections,  and stress reduction  techniques were discussed. C.  A full color page of  Calorie density of various food groups per pound showing examples of each food groups was provided to the patient.  - I have counseled him on diet and weight management by adopting a carbohydrate restricted/protein rich diet. Patient is encouraged to switch to unprocessed or minimally processed complex starch and increased protein intake (animal or plant source), fruits, and vegetables. -  he is advised to stick to a routine mealtimes to eat 3 meals a day and avoid unnecessary snacks (to snack only to correct hypoglycemia).   - he acknowledges that there is a room for improvement in his food and drink choices. - Suggestion is made for him to avoid simple carbohydrates from his diet including Cakes, Sweet Desserts, Ice Cream, Soda (diet and regular), Sweet Tea, Candies, Chips, Cookies, Store Bought Juices, Alcohol in Excess of 1-2 drinks a day, Artificial Sweeteners, Coffee Creamer, and Sugar-free Products. This will help patient to have more stable blood glucose profile and potentially avoid unintended weight gain.  - I have approached him with the following individualized plan to manage his diabetes and patient agrees:   He does not need medications for his diabetes as he has been well controlled over the past year without them.  He can discontinue routine glucose checks.  Will plan to repeat A1c at follow up and if still stable, will be transitioned back to PCP for annual surveillance.  - Specific targets for  A1c; LDL, HDL, and Triglycerides were discussed with the patient.    - he is advised to maintain close follow up with Pia Kerney SQUIBB, MD for primary care needs, as well as his other providers for optimal and coordinated care.   - Time spent in this patient care: 45 min, which was spent in counseling him about his diabetes and the rest reviewing his blood glucose logs, discussing his hypoglycemia and hyperglycemia episodes, reviewing  his current and previous labs/studies (including abstraction from other facilities) and medications doses and developing a long term treatment plan based on the latest standards of care/guidelines; and documenting his care.    Please refer to Patient Instructions for Blood Glucose Monitoring and Insulin/Medications Dosing Guide in media tab for additional information. Please also refer to Patient Self Inventory in the Media tab for reviewed elements of pertinent patient history.  [name] participated in the discussions, expressed understanding, and voiced agreement with the above plans.  All questions were answered to his satisfaction. he is encouraged to contact clinic should he have any questions or concerns prior to his return visit.     Follow up plan: - Return in about 6 months (around 04/06/2025) for Diabetes F/U with A1c in office, No previsit labs.    Benton Rio, [facility] [address] [place], [place] [address] Phone: [phone] Fax: [phone]  10/06/2024, 10:10 AM

## 2024-11-03 ENCOUNTER — Ambulatory Visit: Payer: Medicare Other

## 2024-11-03 DIAGNOSIS — I442 Atrioventricular block, complete: Secondary | ICD-10-CM

## 2024-11-07 LAB — CUP PACEART REMOTE DEVICE CHECK
Battery Impedance: 1584 Ohm
Battery Remaining Longevity: 37 mo
Battery Voltage: 2.75 V
Brady Statistic AP VP Percent: 43 %
Brady Statistic AP VS Percent: 0 %
Brady Statistic AS VP Percent: 57 %
Brady Statistic AS VS Percent: 0 %
Date Time Interrogation Session: 20251107140444
Implantable Lead Connection Status: 753985
Implantable Lead Connection Status: 753985
Implantable Lead Implant Date: 20081205
Implantable Lead Implant Date: 20081205
Implantable Lead Location: 753859
Implantable Lead Location: 753860
Implantable Lead Model: 5076
Implantable Lead Model: 5076
Implantable Pulse Generator Implant Date: 20161115
Lead Channel Impedance Value: 435 Ohm
Lead Channel Impedance Value: 503 Ohm
Lead Channel Pacing Threshold Amplitude: 1 V
Lead Channel Pacing Threshold Amplitude: 1.375 V
Lead Channel Pacing Threshold Pulse Width: 0.4 ms
Lead Channel Pacing Threshold Pulse Width: 0.4 ms
Lead Channel Setting Pacing Amplitude: 2 V
Lead Channel Setting Pacing Amplitude: 2.75 V
Lead Channel Setting Pacing Pulse Width: 0.4 ms
Lead Channel Setting Sensing Sensitivity: 4 mV
Zone Setting Status: 755011
Zone Setting Status: 755011

## 2024-11-08 NOTE — Progress Notes (Signed)
 Remote PPM Transmission

## 2024-11-10 ENCOUNTER — Ambulatory Visit: Payer: Self-pay | Admitting: Cardiovascular Disease

## 2024-11-19 ENCOUNTER — Ambulatory Visit (INDEPENDENT_AMBULATORY_CARE_PROVIDER_SITE_OTHER): Admitting: Urology

## 2024-11-19 ENCOUNTER — Encounter: Payer: Self-pay | Admitting: Urology

## 2024-11-19 VITALS — BP 110/64 | HR 69

## 2024-11-19 DIAGNOSIS — N401 Enlarged prostate with lower urinary tract symptoms: Secondary | ICD-10-CM | POA: Diagnosis not present

## 2024-11-19 DIAGNOSIS — N3943 Post-void dribbling: Secondary | ICD-10-CM | POA: Diagnosis not present

## 2024-11-19 DIAGNOSIS — R351 Nocturia: Secondary | ICD-10-CM | POA: Diagnosis not present

## 2024-11-19 NOTE — Progress Notes (Signed)
 11/19/2024 10:48 AM   [name] [birth year] [id number]  Referring provider: Pia Kerney SQUIBB, MD [address], [address]  [place] [address]  No chief complaint on file.   HPI: Mr Granquist is a 89yo here for followup for BPh with Nocturia. IPSS 5 QOL 1 on flomax  BID and finasterdie 5mg  daily. He has urinary frequency every 2-3 hours. Nocturia 3-4x. Urine stream fair. He denies strainign to urinate. NO urinary incontinence. He has starting/stopping or his stream   PMH: Past Medical History:  Diagnosis Date   Aortic stenosis    mild   Broken ribs    Complete heart block (HCC)    s/p PPM   Diabetes mellitus    HL (hearing loss)    HTN (hypertension)    Malaise and fatigue    Stroke [facility])    Neuro - 3x    Surgical History: Past Surgical History:  Procedure Laterality Date   EP IMPLANTABLE DEVICE N/A 11/14/2015   Procedure: PPM Generator Changeout;  Surgeon: Lynwood Rakers, MD; [employer] Adapta L   HEMORRHOID SURGERY     medtronic senia  12/04/07   PPM implantaed by GT    Home Medications:  Allergies as of 11/19/2024       Reactions   Aspirin    Upset stomach with full strength   Codeine    REACTION: syncope        Medication List        Accurate as of November 19, 2024 10:48 AM. If you have any questions, ask your nurse or doctor.          amLODipine 10 MG tablet Commonly known as: NORVASC Take 10 mg by mouth daily.   atorvastatin 40 MG tablet Commonly known as: LIPITOR Take 40 mg by mouth daily.   cholecalciferol 25 MCG (1000 UNIT) tablet Commonly known as: VITAMIN D3 Take 1,000 Units by mouth daily.   clopidogrel  75 MG tablet Commonly known as: PLAVIX  Take 1 tablet (75 mg total) by mouth daily.   diclofenac Sodium 1 % Gel Commonly known as: VOLTAREN Apply 2 g topically 2 (two) times daily as needed.   fenofibrate 54 MG tablet Take 54 mg by mouth daily.   ferrous sulfate 325 (65 FE) MG EC tablet Take 325 mg by mouth 2  (two) times daily.   finasteride 5 MG tablet Commonly known as: PROSCAR Take 5 mg by mouth daily.   latanoprost 0.005 % ophthalmic solution Commonly known as: XALATAN Place 1 drop into both eyes at bedtime.   levothyroxine 75 MCG tablet Commonly known as: SYNTHROID Take 75 mcg by mouth daily before breakfast.   losartan 100 MG tablet Commonly known as: COZAAR Take 100 mg by mouth daily.   memantine 10 MG tablet Commonly known as: NAMENDA Take 10 mg by mouth 2 (two) times daily.   PreserVision AREDS Caps Take 1 capsule by mouth 2 (two) times daily.   tamsulosin  0.4 MG Caps capsule Commonly known as: FLOMAX  Take 1 capsule (0.4 mg total) by mouth 2 (two) times daily.   VITAMIN B-12 PO Take 1,000 mcg by mouth daily.        Allergies:  Allergies  Allergen Reactions   Aspirin     Upset stomach with full strength   Codeine     REACTION: syncope    Family History: Family History  Problem Relation Age of Onset   Hypertension Other     Social History:  reports that he quit  smoking about 42 years ago. His smoking use included cigarettes. He has never used smokeless tobacco. He reports that he does not drink alcohol and does not use drugs.  ROS: All other review of systems were reviewed and are negative except what is noted above in HPI  Physical Exam: BP 110/64   Pulse 69   Constitutional:  Alert and oriented, No acute distress. HEENT:  AT, moist mucus membranes.  Trachea midline, no masses. Cardiovascular: No clubbing, cyanosis, or edema. Respiratory: Normal respiratory effort, no increased work of breathing. GI: Abdomen is soft, nontender, nondistended, no abdominal masses GU: No CVA tenderness.  Lymph: No cervical or inguinal lymphadenopathy. Skin: No rashes, bruises or suspicious lesions. Neurologic: Grossly intact, no focal deficits, moving all 4 extremities. Psychiatric: Normal mood and affect.  Laboratory Data: Lab Results  Component Value Date    WBC 6.4 11/24/2007   HGB 13.9 11/24/2007   HCT 40.8 11/24/2007   MCV 83.2 11/24/2007   PLT 203 11/24/2007    Lab Results  Component Value Date   CREATININE 1.2 12/18/2023    No results found for: PSA  No results found for: TESTOSTERONE  Lab Results  Component Value Date   HGBA1C 5.9 12/18/2023    Urinalysis    Component Value Date/Time   APPEARANCEUR Clear 01/20/2023 1128   GLUCOSEU Negative 01/20/2023 1128   BILIRUBINUR Negative 01/20/2023 1128   PROTEINUR Trace 01/20/2023 1128   NITRITE Negative 01/20/2023 1128   LEUKOCYTESUR Negative 01/20/2023 1128    Lab Results  Component Value Date   LABMICR See below: 01/20/2023   WBCUA 0-5 01/20/2023   LABEPIT 0-10 01/20/2023   BACTERIA None seen 01/20/2023    Pertinent Imaging:  No results found for this or any previous visit.  No results found for this or any previous visit.  No results found for this or any previous visit.  No results found for this or any previous visit.  No results found for this or any previous visit.  No results found for this or any previous visit.  No results found for this or any previous visit.  No results found for this or any previous visit.   Assessment & Plan:    1. Benign prostatic hyperplasia with post-void dribbling (Primary) Continue flomax  BID and finasteride 5mg   - Urinalysis, Routine w reflex microscopic  2. Nocturia Continue flomax  0.4mg  BID and finasterdie 5mg  daily   No follow-ups on file.  Belvie Clara, MD  [facility] [place]

## 2024-11-19 NOTE — Patient Instructions (Signed)

## 2025-01-05 ENCOUNTER — Inpatient Hospital Stay: Attending: Oncology | Admitting: Oncology

## 2025-01-05 ENCOUNTER — Inpatient Hospital Stay

## 2025-01-05 VITALS — BP 81/53 | HR 100 | Temp 98.8°F | Resp 19 | Ht 73.0 in | Wt 176.4 lb

## 2025-01-05 DIAGNOSIS — E119 Type 2 diabetes mellitus without complications: Secondary | ICD-10-CM | POA: Diagnosis not present

## 2025-01-05 DIAGNOSIS — H409 Unspecified glaucoma: Secondary | ICD-10-CM | POA: Diagnosis not present

## 2025-01-05 DIAGNOSIS — E7800 Pure hypercholesterolemia, unspecified: Secondary | ICD-10-CM | POA: Insufficient documentation

## 2025-01-05 DIAGNOSIS — Z950 Presence of cardiac pacemaker: Secondary | ICD-10-CM | POA: Diagnosis not present

## 2025-01-05 DIAGNOSIS — M899 Disorder of bone, unspecified: Secondary | ICD-10-CM | POA: Insufficient documentation

## 2025-01-05 DIAGNOSIS — Z87891 Personal history of nicotine dependence: Secondary | ICD-10-CM | POA: Diagnosis not present

## 2025-01-05 DIAGNOSIS — N401 Enlarged prostate with lower urinary tract symptoms: Secondary | ICD-10-CM | POA: Diagnosis not present

## 2025-01-05 DIAGNOSIS — E079 Disorder of thyroid, unspecified: Secondary | ICD-10-CM | POA: Insufficient documentation

## 2025-01-05 DIAGNOSIS — I10 Essential (primary) hypertension: Secondary | ICD-10-CM | POA: Insufficient documentation

## 2025-01-05 LAB — COMPREHENSIVE METABOLIC PANEL WITH GFR
ALT: 52 U/L — ABNORMAL HIGH (ref 0–44)
AST: 44 U/L — ABNORMAL HIGH (ref 15–41)
Albumin: 3.3 g/dL — ABNORMAL LOW (ref 3.5–5.0)
Alkaline Phosphatase: 108 U/L (ref 38–126)
Anion gap: 16 — ABNORMAL HIGH (ref 5–15)
BUN: 33 mg/dL — ABNORMAL HIGH (ref 8–23)
CO2: 21 mmol/L — ABNORMAL LOW (ref 22–32)
Calcium: 9.2 mg/dL (ref 8.9–10.3)
Chloride: 105 mmol/L (ref 98–111)
Creatinine, Ser: 1.4 mg/dL — ABNORMAL HIGH (ref 0.61–1.24)
GFR, Estimated: 48 mL/min — ABNORMAL LOW
Glucose, Bld: 235 mg/dL — ABNORMAL HIGH (ref 70–99)
Potassium: 4.3 mmol/L (ref 3.5–5.1)
Sodium: 141 mmol/L (ref 135–145)
Total Bilirubin: 0.5 mg/dL (ref 0.0–1.2)
Total Protein: 6.9 g/dL (ref 6.5–8.1)

## 2025-01-05 LAB — CBC WITH DIFFERENTIAL/PLATELET
Abs Immature Granulocytes: 0.1 K/uL — ABNORMAL HIGH (ref 0.00–0.07)
Basophils Absolute: 0 K/uL (ref 0.0–0.1)
Basophils Relative: 0 %
Eosinophils Absolute: 0.1 K/uL (ref 0.0–0.5)
Eosinophils Relative: 1 %
HCT: 35.4 % — ABNORMAL LOW (ref 39.0–52.0)
Hemoglobin: 11 g/dL — ABNORMAL LOW (ref 13.0–17.0)
Immature Granulocytes: 1 %
Lymphocytes Relative: 5 %
Lymphs Abs: 0.6 K/uL — ABNORMAL LOW (ref 0.7–4.0)
MCH: 27.6 pg (ref 26.0–34.0)
MCHC: 31.1 g/dL (ref 30.0–36.0)
MCV: 88.9 fL (ref 80.0–100.0)
Monocytes Absolute: 1.2 K/uL — ABNORMAL HIGH (ref 0.1–1.0)
Monocytes Relative: 11 %
Neutro Abs: 8.7 K/uL — ABNORMAL HIGH (ref 1.7–7.7)
Neutrophils Relative %: 82 %
Platelets: 356 K/uL (ref 150–400)
RBC: 3.98 MIL/uL — ABNORMAL LOW (ref 4.22–5.81)
RDW: 13.5 % (ref 11.5–15.5)
WBC: 10.6 K/uL — ABNORMAL HIGH (ref 4.0–10.5)
nRBC: 0 % (ref 0.0–0.2)

## 2025-01-05 LAB — PSA: Prostatic Specific Antigen: 30.2 ng/mL — ABNORMAL HIGH (ref 0.00–4.00)

## 2025-01-05 LAB — LACTATE DEHYDROGENASE: LDH: 397 U/L — ABNORMAL HIGH (ref 105–235)

## 2025-01-05 NOTE — Addendum Note (Signed)
 Addended by: Akil Hoos on: 01/05/2025 10:54 PM   Modules accepted: Level of Service

## 2025-01-05 NOTE — Progress Notes (Signed)
 " [facility] [phone]   [phone]  INITIAL CONSULTATION:  Patient Care Team: Pia Kerney SQUIBB, MD as PCP - General (Internal Medicine) Mealor, Eulas BRAVO, MD as PCP - Electrophysiology (Cardiology) Kelsie Agent, MD (Inactive) as Attending Physician (Cardiology)  CHIEF COMPLAINTS/PURPOSE OF CONSULTATION:  Bone lesion  HISTORY OF PRESENTING ILLNESS:  [name] 89 y.o. male with medical history significant for blindness, diabetes, thyroid gland disease, BPH, GERD, glaucoma, hard of hearing, high cholesterol and hypertension.  Patient has past surgical history significant for cardiac pacemaker placement.  Reports he is a former smoker.  Patient presented to [facility] emergency room on 12/19/2024 for right sided abdominal pain for several days.  Workup included imaging which showed concern for metastatic disease with bone lesions.  CT scan from 12/19/2024 showed a lesion along the right aspect of L1/L2 concerning for metastatic disease.  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  Tissue sampling recommended.  Mildly enlarged right pelvic sidewall lymph node.  Small amount of gas in the urinary bladder.  No additional acute inflammatory process in the abdomen or pelvis.  Patient was instructed to follow-up outpatient for additional workup.  Patient is followed by urology Dr. Sherrilee and was last evaluated on 11/19/2024 for follow-up for BPH and nocturia.  He was instructed to continue Flomax  and finasteride.  He is also followed by endocrinology for type 2 diabetes.  Current A1c is 6%.  He is currently not on any medications for diabetes.  Patient had a fall in October 2024 and was evaluated at [facility] ED.  He had a noncontrasted CT of the head which was negative for acute traumatic injury.  Plain x-rays of the pelvis and right humerus were also obtained and were negative for acute  injury.  He had a superficial laceration of skin tear at the distal Phalanx of the left index finger.  He was discharged back to his assisted living facility.  On exam today he is accompanied by his son.  Patient's son and caregiver report he has gone downhill over the past 6 weeks.  He has had several falls but is normally able to get himself up.  Prior to that, he was moving around on his own and did not require assistance.  His weight is down about 10 pounds since mid October due to poor appetite.  Reports he is legally blind and hard of hearing.  Denies any family history of cancers that he is aware of except for skin cancer.  MEDICAL HISTORY:  Past Medical History:  Diagnosis Date   Aortic stenosis    mild   Broken ribs    Complete heart block (HCC)    s/p PPM   Diabetes mellitus    HL (hearing loss)    HTN (hypertension)    Malaise and fatigue    Stroke [facility])    Neuro - 3x    SURGICAL HISTORY: Past Surgical History:  Procedure Laterality Date   EP IMPLANTABLE DEVICE N/A 11/14/2015   Procedure: PPM Generator Changeout;  Surgeon: Agent Kelsie, MD; [employer] Adapta L   HEMORRHOID SURGERY     medtronic senia  12/04/2007   PPM implantaed by [facility]    SOCIAL HISTORY: Social History   Socioeconomic History   Marital status: Married    Spouse name: Patsy   Number of children: Not on file   Years of education: Not on file   Highest education level: Not  on file  Occupational History   Not on file  Tobacco Use   Smoking status: Former    Current packs/day: 0.00    Types: Cigarettes    Quit date: 02/20/1982    Years since quitting: 42.9   Smokeless tobacco: Never  Vaping Use   Vaping status: Never Used  Substance and Sexual Activity   Alcohol use: No    Alcohol/week: 0.0 standard drinks of alcohol   Drug use: Never   Sexual activity: Not on file  Other Topics Concern   Not on file  Social History Narrative   Lives with Wife, Patsy   Right Handed   Drinks no caffeine    Social Drivers of Health   Tobacco Use: Medium Risk (11/19/2024)   Patient History    Smoking Tobacco Use: Former    Smokeless Tobacco Use: Never    Passive Exposure: Not on Actuary Strain: Not on file  Food Insecurity: Not on file  Transportation Needs: Not on file  Physical Activity: Not on file  Stress: Not on file  Social Connections: Not on file  Intimate Partner Violence: Not on file  Depression (PHQ2-9): Low Risk (01/05/2025)   Depression (PHQ2-9)    PHQ-2 Score: 2  Alcohol Screen: Not on file  Housing: Not on file  Utilities: Not on file  Health Literacy: Not on file    FAMILY HISTORY: Family History  Problem Relation Age of Onset   Hypertension Other     ALLERGIES:  is allergic to aspirin, codeine, and lisinopril.  MEDICATIONS:  Current Outpatient Medications  Medication Sig Dispense Refill   Acetaminophen -Pamabrom 500-25 MG TABS Take 2 tablets by mouth.     amLODipine (NORVASC) 10 MG tablet Take 10 mg by mouth daily. for high blood pressure     atorvastatin (LIPITOR) 10 MG tablet Take 10 mg by mouth daily.     cetirizine (ZYRTEC) 10 MG tablet Take 10 mg by mouth.     clopidogrel  (PLAVIX ) 75 MG tablet Take 1 tablet (75 mg total) by mouth daily. 30 tablet 11   fenofibrate 54 MG tablet Take 54 mg by mouth daily.     ferrous sulfate 325 (65 FE) MG EC tablet Take 325 mg by mouth 2 (two) times daily.     finasteride (PROSCAR) 5 MG tablet Take 5 mg by mouth daily.     latanoprost (XALATAN) 0.005 % ophthalmic solution Place 1 drop into both eyes at bedtime.     levothyroxine (SYNTHROID) 75 MCG tablet Take 75 mcg by mouth daily before breakfast.      lisinopril (ZESTRIL) 5 MG tablet Take 5 mg by mouth daily.     losartan (COZAAR) 100 MG tablet Take 100 mg by mouth daily.     memantine (NAMENDA) 10 MG tablet Take 10 mg by mouth 2 (two) times daily.     polyethylene glycol (MIRALAX / GLYCOLAX) 17 g packet Take 17 g by mouth daily.     tamsulosin   (FLOMAX ) 0.4 MG CAPS capsule Take 1 capsule (0.4 mg total) by mouth 2 (two) times daily. 180 capsule 3   Travoprost, BAK Free, (TRAVATAN Z) 0.004 % SOLN ophthalmic solution Apply 1 drop to eye.     No current facility-administered medications for this visit.    REVIEW OF SYSTEMS:   Review of Systems  Constitutional:  Positive for malaise/fatigue and weight loss.  Respiratory:  Positive for cough.   Gastrointestinal:  Positive for constipation.  Musculoskeletal:  Positive for falls  and joint pain.  Neurological:  Positive for dizziness, tingling and sensory change.     PHYSICAL EXAMINATION: ECOG PERFORMANCE STATUS: 1 - Symptomatic but completely ambulatory  Vitals:   01/05/25 0825 01/05/25 0827  BP: (!) 76/43 (!) 81/53  Pulse: 100   Resp: 19   Temp: 98.8 F (37.1 C)   SpO2: 96%    Filed Weights   01/05/25 0825  Weight: 176 lb 6.4 oz (80 kg)    Physical Exam Constitutional:      Appearance: Normal appearance.  Cardiovascular:     Rate and Rhythm: Normal rate and regular rhythm.  Pulmonary:     Effort: Pulmonary effort is normal.     Breath sounds: Normal breath sounds.  Abdominal:     General: Bowel sounds are normal.     Palpations: Abdomen is soft.  Musculoskeletal:        General: No swelling. Normal range of motion.  Neurological:     Mental Status: He is alert and oriented to person, place, and time. Mental status is at baseline.      LABORATORY DATA:  I have reviewed the data as listed    Latest Ref Rng & Units 01/05/2025    9:17 AM 11/24/2007    2:39 PM  CBC  WBC 4.0 - 10.5 K/uL 10.6  6.4   Hemoglobin 13.0 - 17.0 g/dL 88.9  86.0   Hematocrit 39.0 - 52.0 % 35.4  40.8   Platelets 150 - 400 K/uL 356  203        Latest Ref Rng & Units 01/05/2025    9:17 AM 12/18/2023   12:00 AM 03/02/2021   10:20 AM  CMP  Glucose 70 - 99 mg/dL 764     BUN 8 - 23 mg/dL 33  27     15   Creatinine 0.61 - 1.24 mg/dL 8.59  1.2     9.21   Sodium 135 - 145 mmol/L 141      Potassium 3.5 - 5.1 mmol/L 4.3     Chloride 98 - 111 mmol/L 105     CO2 22 - 32 mmol/L 21     Calcium 8.9 - 10.3 mg/dL 9.2     Total Protein 6.5 - 8.1 g/dL 6.9     Total Bilirubin 0.0 - 1.2 mg/dL 0.5     Alkaline Phos 38 - 126 U/L 108     AST 15 - 41 U/L 44     ALT 0 - 44 U/L 52        This result is from an external source.     RADIOGRAPHIC STUDIES: I have personally reviewed the radiological images as listed and agreed with the findings in the report. No results found.  ASSESSMENT & PLAN Assessment & Plan Bone lesion #Bone lesion: --Differentials include benign lesion, metastatic bone lesion, myeloma lesion or soft tissue malignancy.  --Labs today include CBC, CMP, LDH, SPEP/IFE, serum free light chains, PSA  --Recommend PET scan ASAP. --Proceed with IR bone biopsy based on CT scan results. --RTC once workup is complete     Orders Placed This Encounter  Procedures   CT CHEST ABDOMEN PELVIS W CONTRAST    Standing Status:   Future    Expected Date:   01/05/2025    Expiration Date:   04/05/2025    If indicated for the ordered procedure, I authorize the administration of contrast media per Radiology protocol:   Yes    Does the patient have a contrast  media/X-ray dye allergy?:   No    Preferred imaging location?:   [facility]    If indicated for the ordered procedure, I authorize the administration of oral contrast media per Radiology protocol:   Yes   NM PET Image Initial (PI) Skull Base To Thigh (F-18 FDG)    Standing Status:   Future    Expected Date:   01/12/2025    Expiration Date:   01/05/2026    If indicated for the ordered procedure, I authorize the administration of a radiopharmaceutical per Radiology protocol:   Yes    Preferred imaging location?:   [facility]    Release to patient:   Immediate   CBC with Differential    Standing Status:   Future    Number of Occurrences:   1    Expected Date:   01/05/2025    Expiration Date:   04/05/2025   Comprehensive  metabolic panel    Standing Status:   Future    Number of Occurrences:   1    Expected Date:   01/05/2025    Expiration Date:   04/05/2025   Lactate dehydrogenase    Standing Status:   Future    Number of Occurrences:   1    Expected Date:   01/05/2025    Expiration Date:   04/05/2025   Multiple Myeloma Panel (SPEP&IFE w/QIG)    Standing Status:   Future    Number of Occurrences:   1    Expected Date:   01/05/2025    Expiration Date:   04/05/2025   Kappa/lambda light chains    Standing Status:   Future    Number of Occurrences:   1    Expected Date:   01/05/2025    Expiration Date:   04/05/2025   PSA    Standing Status:   Future    Number of Occurrences:   1    Expected Date:   01/05/2025    Expiration Date:   04/05/2025   Comprehensive metabolic panel with GFR    Standing Status:   Future    Expected Date:   01/05/2025    Expiration Date:   04/05/2025    All questions were answered. The patient knows to call the clinic with any problems, questions or concerns.  I have spent a total of 40 minutes minutes of face-to-face and non-face-to-face time, preparing to see the patient, obtaining and/or reviewing separately obtained history, performing a medically appropriate examination, counseling and educating the patient, ordering medications/tests/procedures, referring and communicating with other health care professionals, documenting clinical information in the electronic health record, independently interpreting results and communicating results to the patient, and care coordination.   Delon Hope, AGNP-C Department of Hematology/Oncology [facility] at [facility]  Phone: [phone]  "

## 2025-01-05 NOTE — Assessment & Plan Note (Addendum)
#  Bone lesion: --Differentials include benign lesion, metastatic bone lesion, myeloma lesion or soft tissue malignancy.  --Labs today include CBC, CMP, LDH, SPEP/IFE, serum free light chains, PSA  --Recommend PET scan ASAP. --Proceed with IR bone biopsy based on CT scan results. --RTC once workup is complete

## 2025-01-05 NOTE — Patient Instructions (Addendum)
 [facility]  Discharge Instructions   Rapid Diagnostic Service Visit Discharge Information and Instructions  Thank you for choosing [facility] for your healthcare needs.  Below is a summary of today's discussion, along with our contact information and an outline of what to expect next.  Reason for Visit:  spinal lesions  Proposed Diagnostic Care Plan: Labs today. We will schedule you for a PET scan. You will follow up with Delon Hope, NP after PET scan.   What to Expect: - Generally, when lab tests are ordered the results can take up to 1 week for results to be available.  At that point, we will contact you to discuss your results with you.  Unless there is a critical result, we will typically wait for all of your lab results to be available before contacting you. - If a biopsy is part of your Care Plan, those results can take on average 7-10 days to result.  Once results are available, we will contact you to discuss your pathology results and any next steps. - If you have additional imaging ordered, such as a CT Scan, MRI, Ultrasound, Bone Scan, or PET scan, your imaging will need to be authorized then scheduled with the earliest available appointment.  You may be asked to travel to another hospital within [facility] who has a sooner availability, please consider doing so if asked. - If you use MyChart, your results will be available to you in the MyChart portal.  Your provider will be in touch with you as soon as all of your results are available to be discussed.  Your Diagnostic Clinic Provider:  Delon Hope, NP. Your Diagnostic Navigator:  Dena Daring, RN.  Contact number [phone].  If you or your caregiver have number blocking on your cell phones, please ensure the cancer center's numbers are not blocked.  If you are not a registered MyChart user, please consider enrolling in MyChart to receive your test results and visit notes.  You can  also access your discharge instructions electronically.  MyChart also gives you an electronic means to communicate with your Care Team instead of needing to call in to the cancer center.  We appreciate you trusting us  with your healthcare and look forward to partnering with you as we work to uncover what your potential diagnosis may be.  Please do not hesitate to reach out at any point with questions or concerns.      Thank you for choosing  [facility] Center - [facility] to provide your oncology and hematology care.   To afford each patient quality time with our provider, please arrive at least 15 minutes before your scheduled appointment time. You may need to reschedule your appointment if you arrive late (10 or more minutes). Arriving late affects you and other patients whose appointments are after yours.  Also, if you miss three or more appointments without notifying the office, you may be dismissed from the clinic at the providers discretion.    Again, thank you for choosing [facility].  Our hope is that these requests will decrease the amount of time that you wait before being seen by our physicians.   If you have a lab appointment with the Cancer Center - please note that after April 8th, all labs will be drawn in the cancer center.  You do not have to check in or register with the main entrance as you have in the past but will  complete your check-in at the cancer center.            _____________________________________________________________  Should you have questions after your visit to [facility], please contact our office at [phone] and follow the prompts.  Our office hours are 8:00 a.m. to 4:30 p.m. Monday - Thursday and 8:00 a.m. to 2:30 p.m. Friday.  Please note that voicemails left after 4:00 p.m. may not be returned until the following business day.  We are closed weekends and all major holidays.  You do have access to a nurse 24-7, just  call the main number to the clinic [phone] and do not press any options, hold on the line and a nurse will answer the phone.    For prescription refill requests, have your pharmacy contact our office and allow 72 hours.    Masks are no longer required in the cancer centers. If you would like for your care team to wear a mask while they are taking care of you, please let them know. You may have one support person who is at least 89 years old accompany you for your appointments.

## 2025-01-06 LAB — KAPPA/LAMBDA LIGHT CHAINS
Kappa free light chain: 50.7 mg/L — ABNORMAL HIGH (ref 3.3–19.4)
Kappa, lambda light chain ratio: 1.37 (ref 0.26–1.65)
Lambda free light chains: 36.9 mg/L — ABNORMAL HIGH (ref 5.7–26.3)

## 2025-01-08 ENCOUNTER — Ambulatory Visit (HOSPITAL_BASED_OUTPATIENT_CLINIC_OR_DEPARTMENT_OTHER)

## 2025-01-08 LAB — MULTIPLE MYELOMA PANEL, SERUM
Albumin SerPl Elph-Mcnc: 2.5 g/dL — ABNORMAL LOW (ref 2.9–4.4)
Albumin/Glob SerPl: 0.7 (ref 0.7–1.7)
Alpha 1: 0.4 g/dL (ref 0.0–0.4)
Alpha2 Glob SerPl Elph-Mcnc: 1.1 g/dL — ABNORMAL HIGH (ref 0.4–1.0)
B-Globulin SerPl Elph-Mcnc: 1.1 g/dL (ref 0.7–1.3)
Gamma Glob SerPl Elph-Mcnc: 1.1 g/dL (ref 0.4–1.8)
Globulin, Total: 3.7 g/dL (ref 2.2–3.9)
IgA: 209 mg/dL (ref 61–437)
IgG (Immunoglobin G), Serum: 1277 mg/dL (ref 603–1613)
IgM (Immunoglobulin M), Srm: 45 mg/dL (ref 15–143)
M Protein SerPl Elph-Mcnc: 0.4 g/dL — ABNORMAL HIGH
Total Protein ELP: 6.2 g/dL (ref 6.0–8.5)

## 2025-01-20 ENCOUNTER — Other Ambulatory Visit (HOSPITAL_COMMUNITY)

## 2025-01-26 ENCOUNTER — Ambulatory Visit (INDEPENDENT_AMBULATORY_CARE_PROVIDER_SITE_OTHER): Admitting: Gastroenterology

## 2025-02-02 ENCOUNTER — Inpatient Hospital Stay: Admitting: Oncology

## 2025-02-04 ENCOUNTER — Telehealth: Payer: Self-pay | Admitting: Cardiovascular Disease

## 2025-02-04 NOTE — Telephone Encounter (Signed)
 Patient was called regarding missed remote transmission. #[id number] [id number]

## 2025-04-06 ENCOUNTER — Ambulatory Visit: Admitting: Nurse Practitioner

## 2025-11-09 ENCOUNTER — Ambulatory Visit: Admitting: Urology
